# Patient Record
Sex: Male | Born: 1960 | Hispanic: No | Marital: Married | Smoking: Never smoker
Health system: Southern US, Community
[De-identification: ages and names within clinical notes are randomized; demographics above are authoritative.]

## PROBLEM LIST (undated history)

## (undated) DIAGNOSIS — T7840XA Allergy, unspecified, initial encounter: Secondary | ICD-10-CM

## (undated) HISTORY — PX: COLONOSCOPY: SHX174

## (undated) HISTORY — DX: Allergy, unspecified, initial encounter: T78.40XA

---

## 1998-09-14 ENCOUNTER — Other Ambulatory Visit: Admission: RE | Admit: 1998-09-14 | Discharge: 1998-09-14 | Payer: Self-pay | Admitting: Oncology

## 2005-11-21 ENCOUNTER — Ambulatory Visit: Payer: Self-pay

## 2013-10-10 HISTORY — PX: KIDNEY DONATION: SHX685

## 2013-10-10 HISTORY — PX: KIDNEY TRANSPLANT: SHX239

## 2014-04-02 DIAGNOSIS — D509 Iron deficiency anemia, unspecified: Secondary | ICD-10-CM | POA: Insufficient documentation

## 2014-04-02 DIAGNOSIS — D72819 Decreased white blood cell count, unspecified: Secondary | ICD-10-CM | POA: Insufficient documentation

## 2014-09-24 DIAGNOSIS — Z524 Kidney donor: Secondary | ICD-10-CM

## 2016-05-20 DIAGNOSIS — L209 Atopic dermatitis, unspecified: Secondary | ICD-10-CM | POA: Diagnosis not present

## 2016-11-14 DIAGNOSIS — L209 Atopic dermatitis, unspecified: Secondary | ICD-10-CM | POA: Diagnosis not present

## 2016-11-14 DIAGNOSIS — B351 Tinea unguium: Secondary | ICD-10-CM | POA: Diagnosis not present

## 2017-04-19 DIAGNOSIS — J020 Streptococcal pharyngitis: Secondary | ICD-10-CM | POA: Diagnosis not present

## 2017-04-19 DIAGNOSIS — Z6829 Body mass index (BMI) 29.0-29.9, adult: Secondary | ICD-10-CM | POA: Diagnosis not present

## 2017-12-25 DIAGNOSIS — J309 Allergic rhinitis, unspecified: Secondary | ICD-10-CM | POA: Diagnosis not present

## 2017-12-25 DIAGNOSIS — Z6829 Body mass index (BMI) 29.0-29.9, adult: Secondary | ICD-10-CM | POA: Diagnosis not present

## 2018-05-08 DIAGNOSIS — L578 Other skin changes due to chronic exposure to nonionizing radiation: Secondary | ICD-10-CM | POA: Diagnosis not present

## 2018-05-08 DIAGNOSIS — B351 Tinea unguium: Secondary | ICD-10-CM | POA: Diagnosis not present

## 2018-05-08 DIAGNOSIS — B356 Tinea cruris: Secondary | ICD-10-CM | POA: Diagnosis not present

## 2018-05-11 DIAGNOSIS — R531 Weakness: Secondary | ICD-10-CM | POA: Diagnosis not present

## 2018-06-04 ENCOUNTER — Encounter: Payer: Self-pay | Admitting: Gastroenterology

## 2018-06-15 ENCOUNTER — Encounter: Payer: Self-pay | Admitting: Gastroenterology

## 2018-06-18 ENCOUNTER — Telehealth: Payer: Self-pay

## 2018-06-18 NOTE — Telephone Encounter (Signed)
As far as I can recall, was significant family history of colonic polyps in a first-degree relative. Can be use it as screening colon code?

## 2018-06-18 NOTE — Telephone Encounter (Signed)
Dr. Chales Abrahams,  I was preparing a pre-visit chart for [facility]. He had a screening colonoscopy 05/16/13. This patient appears to be a routine recall for family history of polyps. Last colonoscopy was normal. Patient is scheduled for a colonoscopy on 07/09/18. PV is scheduled on 06/25/18. It is my understanding that insurance may not pay for his colonoscopy.  The patient does not have any symptoms and has not been seen in the office for symptoms. Please advise. Thanks.    Janalee Dane, LPN ( PV )

## 2018-06-18 NOTE — Telephone Encounter (Signed)
np

## 2018-06-18 NOTE — Telephone Encounter (Signed)
Hi Dr. Chales Abrahams,  My apologies, I spoke to  Dr. Rhea Belton and he said " YES" it would be ok to use Family history of colon polyps as a diagnostic code for a first degree relative. We will proceed on as scheduled. Thanks!   Janalee Dane, LPN ( PV )

## 2018-06-25 ENCOUNTER — Encounter: Payer: Self-pay | Admitting: Gastroenterology

## 2018-06-25 ENCOUNTER — Ambulatory Visit (AMBULATORY_SURGERY_CENTER): Payer: Self-pay

## 2018-06-25 VITALS — Ht 68.0 in | Wt 183.2 lb

## 2018-06-25 DIAGNOSIS — Z8371 Family history of colonic polyps: Secondary | ICD-10-CM

## 2018-06-25 NOTE — Progress Notes (Signed)
No egg or soy allergy known to patient  No issues with past sedation with any surgeries  or procedures, no intubation problems  No diet pills per patient No home 02 use per patient  No blood thinners per patient  Pt denies issues with constipation  No A fib or A flutter  EMMI video sent to pt's e mail  Pt. declined 

## 2018-07-09 ENCOUNTER — Ambulatory Visit (AMBULATORY_SURGERY_CENTER): Payer: BLUE CROSS/BLUE SHIELD | Admitting: Gastroenterology

## 2018-07-09 ENCOUNTER — Encounter: Payer: Self-pay | Admitting: Gastroenterology

## 2018-07-09 VITALS — BP 119/89 | HR 61 | Temp 98.8°F | Resp 17 | Ht 68.0 in | Wt 183.0 lb

## 2018-07-09 DIAGNOSIS — Z1211 Encounter for screening for malignant neoplasm of colon: Secondary | ICD-10-CM

## 2018-07-09 DIAGNOSIS — Z86010 Personal history of colonic polyps: Secondary | ICD-10-CM | POA: Diagnosis not present

## 2018-07-09 DIAGNOSIS — Z8371 Family history of colonic polyps: Secondary | ICD-10-CM

## 2018-07-09 MED ORDER — SODIUM CHLORIDE 0.9 % IV SOLN: 500.0000 mL | Freq: Once | INTRAVENOUS | Status: DC

## 2018-07-09 NOTE — Progress Notes (Signed)
Report given to PACU, vss 

## 2018-07-09 NOTE — Patient Instructions (Signed)
HANDOUT GIVEN FOR HEMORRHOIDS  YOU HAD AN ENDOSCOPIC PROCEDURE TODAY AT THE [facility]:   Refer to the procedure report that was given to you for any specific questions about what was found during the examination.  If the procedure report does not answer your questions, please call your gastroenterologist to clarify.  If you requested that your care partner not be given the details of your procedure findings, then the procedure report has been included in a sealed envelope for you to review at your convenience later.  YOU SHOULD EXPECT: Some feelings of bloating in the abdomen. Passage of more gas than usual.  Walking can help get rid of the air that was put into your GI tract during the procedure and reduce the bloating. If you had a lower endoscopy (such as a colonoscopy or flexible sigmoidoscopy) you may notice spotting of blood in your stool or on the toilet paper. If you underwent a bowel prep for your procedure, you may not have a normal bowel movement for a few days.  Please Note:  You might notice some irritation and congestion in your nose or some drainage.  This is from the oxygen used during your procedure.  There is no need for concern and it should clear up in a day or so.  SYMPTOMS TO REPORT IMMEDIATELY:   Following lower endoscopy (colonoscopy or flexible sigmoidoscopy):  Excessive amounts of blood in the stool  Significant tenderness or worsening of abdominal pains  Swelling of the abdomen that is new, acute  Fever of 100F or higher  For urgent or emergent issues, a gastroenterologist can be reached at any hour by calling [phone].   DIET:  We do recommend a small meal at first, but then you may proceed to your regular diet.  Drink plenty of fluids but you should avoid alcoholic beverages for 24 hours.  ACTIVITY:  You should plan to take it easy for the rest of today and you should NOT DRIVE or use heavy machinery until tomorrow (because of the sedation  medicines used during the test).    FOLLOW UP: Our staff will call the number listed on your records the next business day following your procedure to check on you and address any questions or concerns that you may have regarding the information given to you following your procedure. If we do not reach you, we will leave a message.  However, if you are feeling well and you are not experiencing any problems, there is no need to return our call.  We will assume that you have returned to your regular daily activities without incident.  If any biopsies were taken you will be contacted by phone or by letter within the next 1-3 weeks.  Please call us at [phone] if you have not heard about the biopsies in 3 weeks.    SIGNATURES/CONFIDENTIALITY: You and/or your care partner have signed paperwork which will be entered into your electronic medical record.  These signatures attest to the fact that that the information above on your After Visit Summary has been reviewed and is understood.  Full responsibility of the confidentiality of this discharge information lies with you and/or your care-partner. 

## 2018-07-09 NOTE — Op Note (Signed)
[facility] Patient Name: Kent Kelly Procedure Date: 07/09/2018 8:07 AM MRN: [id number] Endoscopist: Lynann Bologna , MD Age: 57 Referring MD:  Date of Birth: 01/11/1961 Gender: Male Account #: [id number] Procedure:                Colonoscopy Indications:              Family history of colon polyps Medicines:                Monitored Anesthesia Care Procedure:                Pre-Anesthesia Assessment:                           - Prior to the procedure, a History and Physical                            was performed, and patient medications and                            allergies were reviewed. The patient's tolerance of                            previous anesthesia was also reviewed. The risks                            and benefits of the procedure and the sedation                            options and risks were discussed with the patient.                            All questions were answered, and informed consent                            was obtained. Prior Anticoagulants: The patient has                            taken no previous anticoagulant or antiplatelet                            agents. ASA Grade Assessment: II - A patient with                            mild systemic disease. After reviewing the risks                            and benefits, the patient was deemed in                            satisfactory condition to undergo the procedure.                           After obtaining informed consent, the colonoscope  was passed under direct vision. Throughout the                            procedure, the patient's blood pressure, pulse, and                            oxygen saturations were monitored continuously. The                            Model CF-HQ190L [id number]) scope was introduced                            through the anus and advanced to the the cecum,                            identified by appendiceal orifice  and ileocecal                            valve. The colonoscopy was performed without                            difficulty. The patient tolerated the procedure                            well. The quality of the bowel preparation was good. Scope In: 8:11:40 AM Scope Out: 8:23:12 AM Scope Withdrawal Time: 0 hours 8 minutes 17 seconds  Total Procedure Duration: 0 hours 11 minutes 32 seconds  Findings:                 Non-bleeding internal hemorrhoids were found. The                            hemorrhoids were small.                           The exam was otherwise without abnormality on                            direct and retroflexion views. Complications:            No immediate complications. Estimated Blood Loss:     Estimated blood loss: none. Impression:               - Non-bleeding internal hemorrhoids.                           - The examination was otherwise normal on direct                            and retroflexion views.                           - No specimens collected. Recommendation:           - Patient has a contact number available for  emergencies. The signs and symptoms of potential                            delayed complications were discussed with the                            patient. Return to normal activities tomorrow.                            Written discharge instructions were provided to the                            patient.                           - Resume previous diet.                           - Continue present medications.                           - Repeat colonoscopy in 10 years for screening                            purposes. Earlier, if with any new problems or if                            there is any change in family history.                           - Return to GI clinic PRN. Lynann Bologna, MD 07/09/2018 8:27:22 AM This report has been signed electronically.

## 2018-07-10 ENCOUNTER — Telehealth: Payer: Self-pay

## 2018-07-10 ENCOUNTER — Telehealth: Payer: Self-pay | Admitting: *Deleted

## 2018-07-10 NOTE — Telephone Encounter (Signed)
First post procedure follow up call, no answer 

## 2018-07-10 NOTE — Telephone Encounter (Signed)
  Follow up Call-  Call back number 07/09/2018  Post procedure Call Back phone  # [phone]  Permission to leave phone message Yes  Some recent data might be hidden     Patient questions:  Message left to call us if necessary.

## 2018-08-17 ENCOUNTER — Telehealth: Payer: Self-pay | Admitting: Gastroenterology

## 2018-08-17 NOTE — Telephone Encounter (Signed)
Pt is requesting to speak with Dr. Chales Abrahams regarding his last colon, he is upset because he was told that his proc was going to be preventive but he received a bill for it. I transferred pt to billing but he called back again and stated that nobody answered and that he had called billing a couple of times before and got no help so pt is adamant to speak with Dr. Chales Abrahams and stated that after he speaks with him he would do anything else that he needs to do.

## 2018-08-17 NOTE — Telephone Encounter (Signed)
See note below

## 2018-08-17 NOTE — Telephone Encounter (Signed)
Should be screening colonoscopy Kent Kelly, can you please check on this Pl run it by our coder as well We did not remove any polyps

## 2018-08-20 NOTE — Telephone Encounter (Signed)
Sheri how do I get this to the coder?

## 2018-08-21 NOTE — Telephone Encounter (Signed)
Pt called regarding this message, I let him know that we are still working and looking into it and will give him a call when we have a resolution.

## 2018-08-21 NOTE — Telephone Encounter (Signed)
Noted  

## 2019-01-24 ENCOUNTER — Ambulatory Visit: Payer: BLUE CROSS/BLUE SHIELD

## 2019-07-01 ENCOUNTER — Other Ambulatory Visit: Payer: Self-pay

## 2019-07-01 ENCOUNTER — Ambulatory Visit: Payer: Self-pay

## 2019-07-28 DIAGNOSIS — K529 Noninfective gastroenteritis and colitis, unspecified: Secondary | ICD-10-CM | POA: Diagnosis not present

## 2019-07-30 ENCOUNTER — Inpatient Hospital Stay (HOSPITAL_COMMUNITY)
Admission: EM | Admit: 2019-07-30 | Discharge: 2019-08-08 | DRG: 342 | Disposition: A | Discharge status: Home / Self Care | Payer: BLUE CROSS/BLUE SHIELD | Attending: General Surgery | Admitting: General Surgery

## 2019-07-30 ENCOUNTER — Emergency Department (HOSPITAL_COMMUNITY): Payer: BLUE CROSS/BLUE SHIELD

## 2019-07-30 ENCOUNTER — Encounter (HOSPITAL_COMMUNITY): Payer: Self-pay | Admitting: *Deleted

## 2019-07-30 ENCOUNTER — Other Ambulatory Visit: Payer: Self-pay

## 2019-07-30 DIAGNOSIS — N289 Disorder of kidney and ureter, unspecified: Secondary | ICD-10-CM | POA: Diagnosis not present

## 2019-07-30 DIAGNOSIS — Z905 Acquired absence of kidney: Secondary | ICD-10-CM | POA: Diagnosis not present

## 2019-07-30 DIAGNOSIS — Z524 Kidney donor: Secondary | ICD-10-CM

## 2019-07-30 DIAGNOSIS — Z79899 Other long term (current) drug therapy: Secondary | ICD-10-CM

## 2019-07-30 DIAGNOSIS — E860 Dehydration: Secondary | ICD-10-CM | POA: Diagnosis present

## 2019-07-30 DIAGNOSIS — K3580 Unspecified acute appendicitis: Secondary | ICD-10-CM | POA: Diagnosis not present

## 2019-07-30 DIAGNOSIS — R7989 Other specified abnormal findings of blood chemistry: Secondary | ICD-10-CM

## 2019-07-30 DIAGNOSIS — R0602 Shortness of breath: Secondary | ICD-10-CM | POA: Diagnosis not present

## 2019-07-30 DIAGNOSIS — K56609 Unspecified intestinal obstruction, unspecified as to partial versus complete obstruction: Secondary | ICD-10-CM

## 2019-07-30 DIAGNOSIS — I129 Hypertensive chronic kidney disease with stage 1 through stage 4 chronic kidney disease, or unspecified chronic kidney disease: Secondary | ICD-10-CM | POA: Diagnosis not present

## 2019-07-30 DIAGNOSIS — K567 Ileus, unspecified: Secondary | ICD-10-CM | POA: Diagnosis not present

## 2019-07-30 DIAGNOSIS — R140 Abdominal distension (gaseous): Secondary | ICD-10-CM | POA: Diagnosis not present

## 2019-07-30 DIAGNOSIS — K352 Acute appendicitis with generalized peritonitis, without abscess: Secondary | ICD-10-CM | POA: Diagnosis not present

## 2019-07-30 DIAGNOSIS — N182 Chronic kidney disease, stage 2 (mild): Secondary | ICD-10-CM | POA: Insufficient documentation

## 2019-07-30 DIAGNOSIS — Z4682 Encounter for fitting and adjustment of non-vascular catheter: Secondary | ICD-10-CM | POA: Diagnosis not present

## 2019-07-30 DIAGNOSIS — R109 Unspecified abdominal pain: Secondary | ICD-10-CM | POA: Diagnosis not present

## 2019-07-30 DIAGNOSIS — Z0189 Encounter for other specified special examinations: Secondary | ICD-10-CM

## 2019-07-30 DIAGNOSIS — K37 Unspecified appendicitis: Secondary | ICD-10-CM | POA: Diagnosis present

## 2019-07-30 DIAGNOSIS — N1831 Chronic kidney disease, stage 3a: Secondary | ICD-10-CM | POA: Diagnosis not present

## 2019-07-30 DIAGNOSIS — N179 Acute kidney failure, unspecified: Secondary | ICD-10-CM | POA: Diagnosis present

## 2019-07-30 DIAGNOSIS — Z20828 Contact with and (suspected) exposure to other viral communicable diseases: Secondary | ICD-10-CM | POA: Diagnosis present

## 2019-07-30 DIAGNOSIS — Z8371 Family history of colonic polyps: Secondary | ICD-10-CM

## 2019-07-30 DIAGNOSIS — I10 Essential (primary) hypertension: Secondary | ICD-10-CM | POA: Diagnosis not present

## 2019-07-30 DIAGNOSIS — K659 Peritonitis, unspecified: Secondary | ICD-10-CM | POA: Diagnosis not present

## 2019-07-30 LAB — CBC
HCT: 48.5 % (ref 39.0–52.0)
Hemoglobin: 15.3 g/dL (ref 13.0–17.0)
MCH: 23 pg — ABNORMAL LOW (ref 26.0–34.0)
MCHC: 31.5 g/dL (ref 30.0–36.0)
MCV: 73 fL — ABNORMAL LOW (ref 80.0–100.0)
Platelets: 261 10*3/uL (ref 150–400)
RBC: 6.64 MIL/uL — ABNORMAL HIGH (ref 4.22–5.81)
RDW: 16.7 % — ABNORMAL HIGH (ref 11.5–15.5)
WBC: 10.9 10*3/uL — ABNORMAL HIGH (ref 4.0–10.5)
nRBC: 0 % (ref 0.0–0.2)

## 2019-07-30 LAB — COMPREHENSIVE METABOLIC PANEL
ALT: 16 U/L (ref 0–44)
AST: 17 U/L (ref 15–41)
Albumin: 3.6 g/dL (ref 3.5–5.0)
Alkaline Phosphatase: 57 U/L (ref 38–126)
Anion gap: 16 — ABNORMAL HIGH (ref 5–15)
BUN: 43 mg/dL — ABNORMAL HIGH (ref 6–20)
CO2: 19 mmol/L — ABNORMAL LOW (ref 22–32)
Calcium: 8.8 mg/dL — ABNORMAL LOW (ref 8.9–10.3)
Chloride: 95 mmol/L — ABNORMAL LOW (ref 98–111)
Creatinine, Ser: 2.56 mg/dL — ABNORMAL HIGH (ref 0.61–1.24)
GFR calc Af Amer: 31 mL/min — ABNORMAL LOW (ref >60)
GFR calc non Af Amer: 27 mL/min — ABNORMAL LOW (ref >60)
Glucose, Bld: 108 mg/dL — ABNORMAL HIGH (ref 70–99)
Potassium: 4.2 mmol/L (ref 3.5–5.1)
Sodium: 130 mmol/L — ABNORMAL LOW (ref 135–145)
Total Bilirubin: 1.2 mg/dL (ref 0.3–1.2)
Total Protein: 8.6 g/dL — ABNORMAL HIGH (ref 6.5–8.1)

## 2019-07-30 LAB — URINALYSIS, ROUTINE W REFLEX MICROSCOPIC
Bilirubin Urine: NEGATIVE
Glucose, UA: NEGATIVE mg/dL
Ketones, ur: 5 mg/dL — AB
Leukocytes,Ua: NEGATIVE
Nitrite: NEGATIVE
Protein, ur: 30 mg/dL — AB
Specific Gravity, Urine: 1.016 (ref 1.005–1.030)
pH: 5 (ref 5.0–8.0)

## 2019-07-30 LAB — LIPASE, BLOOD: Lipase: 21 U/L (ref 11–51)

## 2019-07-30 MED ORDER — MORPHINE SULFATE (PF) 2 MG/ML IV SOLN
2.0000 mg | INTRAVENOUS | Status: DC | PRN
  Administered 2019-07-30 – 2019-07-31 (×3): 2 mg via INTRAVENOUS
  Filled 2019-07-30 (×3): qty 1

## 2019-07-30 MED ORDER — SODIUM CHLORIDE 0.9 % IV BOLUS
1000.0000 mL | Freq: Once | INTRAVENOUS | Status: AC
  Administered 2019-07-30: 1000 mL via INTRAVENOUS

## 2019-07-30 MED ORDER — ACETAMINOPHEN 650 MG RE SUPP: 650.0000 mg | Freq: Four times a day (QID) | RECTAL | Status: DC | PRN

## 2019-07-30 MED ORDER — SODIUM CHLORIDE 0.9% FLUSH
3.0000 mL | Freq: Once | INTRAVENOUS | Status: AC
  Administered 2019-07-30: 3 mL via INTRAVENOUS

## 2019-07-30 MED ORDER — HEPARIN SODIUM (PORCINE) 5000 UNIT/ML IJ SOLN
5000.0000 [IU] | Freq: Three times a day (TID) | INTRAMUSCULAR | Status: DC
  Administered 2019-07-31: 5000 [IU] via SUBCUTANEOUS
  Filled 2019-07-30 (×2): qty 1

## 2019-07-30 MED ORDER — SODIUM CHLORIDE 0.9 % IV SOLN
INTRAVENOUS | Status: DC
  Administered 2019-07-31 (×2): via INTRAVENOUS

## 2019-07-30 MED ORDER — ONDANSETRON HCL 4 MG/2ML IJ SOLN
4.0000 mg | Freq: Once | INTRAMUSCULAR | Status: AC
  Administered 2019-07-30: 4 mg via INTRAVENOUS
  Filled 2019-07-30: qty 2

## 2019-07-30 MED ORDER — SIMETHICONE 80 MG PO CHEW
40.0000 mg | CHEWABLE_TABLET | Freq: Four times a day (QID) | ORAL | Status: DC | PRN
  Administered 2019-08-01: 40 mg via ORAL
  Filled 2019-07-30 (×2): qty 1

## 2019-07-30 MED ORDER — METOPROLOL TARTRATE 5 MG/5ML IV SOLN: 5.0000 mg | Freq: Four times a day (QID) | INTRAVENOUS | Status: DC | PRN

## 2019-07-30 MED ORDER — OXYCODONE HCL 5 MG PO TABS: 5.0000 mg | ORAL_TABLET | ORAL | Status: DC | PRN

## 2019-07-30 MED ORDER — ONDANSETRON HCL 4 MG/2ML IJ SOLN
4.0000 mg | Freq: Four times a day (QID) | INTRAMUSCULAR | Status: DC | PRN
  Administered 2019-07-31 – 2019-08-03 (×4): 4 mg via INTRAVENOUS
  Filled 2019-07-30 (×3): qty 2

## 2019-07-30 MED ORDER — ONDANSETRON 4 MG PO TBDP: 4.0000 mg | ORAL_TABLET | Freq: Four times a day (QID) | ORAL | Status: DC | PRN

## 2019-07-30 MED ORDER — MORPHINE SULFATE (PF) 4 MG/ML IV SOLN
4.0000 mg | Freq: Once | INTRAVENOUS | Status: AC
  Administered 2019-07-30: 4 mg via INTRAVENOUS
  Filled 2019-07-30: qty 1

## 2019-07-30 MED ORDER — PIPERACILLIN-TAZOBACTAM 3.375 G IVPB
3.3750 g | Freq: Three times a day (TID) | INTRAVENOUS | Status: AC
  Administered 2019-07-30 – 2019-08-06 (×22): 3.375 g via INTRAVENOUS
  Filled 2019-07-30 (×20): qty 50

## 2019-07-30 MED ORDER — ACETAMINOPHEN 325 MG PO TABS: 650.0000 mg | ORAL_TABLET | Freq: Four times a day (QID) | ORAL | Status: DC | PRN

## 2019-07-30 MED ORDER — PIPERACILLIN-TAZOBACTAM 3.375 G IVPB 30 MIN
3.3750 g | Freq: Once | INTRAVENOUS | Status: AC
  Administered 2019-07-30: 3.375 g via INTRAVENOUS
  Filled 2019-07-30: qty 50

## 2019-07-30 NOTE — ED Notes (Signed)
Patient ambulated to restroom with no assistance for UA.

## 2019-07-30 NOTE — H&P (Addendum)
Kent Kelly is an 58 y.o. male.   Chief Complaint: ab pain HPI: [id number] yom consult from Dr Ranae PalmsYelverton for abd pain.  He is husband of one of my breast cancer patients.  He has had diffuse abd pain since Saturday that has been worsening.  Nothing making better.  Not eating or drinking much.  Having some flatus and loose stools. No emesis.  No fevers.  He has prior left donor nephrectomy. He has had ct scan in er and appears to have appendicitis.  Past Medical History:  Diagnosis Date  . Allergy     Past Surgical History:  Procedure Laterality Date  . COLONOSCOPY    . KIDNEY TRANSPLANT Left 2015    Family History  Problem Relation Age of Onset  . Colon polyps Sister   . Colon cancer Neg Hx   . Esophageal cancer Neg Hx   . Stomach cancer Neg Hx   . Rectal cancer Neg Hx    Social History:  reports that he has never smoked. He has never used smokeless tobacco. He reports that he does not use drugs. No history on file for alcohol.  Allergies:  Allergies  Allergen Reactions  . Pollen Extract     (Not in a hospital admission)   Results for orders placed or performed during the hospital encounter of 07/30/19 (from the past 48 hour(s))  Lipase, blood     Status: None   Collection Time: 07/30/19  1:38 PM  Result Value Ref Range   Lipase 21 11 - 51 U/L    Comment: Performed at [facility], [address] [place], [place] [address]  Comprehensive metabolic panel     Status: Abnormal   Collection Time: 07/30/19  1:38 PM  Result Value Ref Range   Sodium 130 (L) 135 - 145 mmol/L   Potassium 4.2 3.5 - 5.1 mmol/L   Chloride 95 (L) 98 - 111 mmol/L   CO2 19 (L) 22 - 32 mmol/L   Glucose, Bld 108 (H) 70 - 99 mg/dL   BUN 43 (H) 6 - 20 mg/dL   Creatinine, Ser 6.042.56 (H) 0.61 - 1.24 mg/dL   Calcium 8.8 (L) 8.9 - 10.3 mg/dL   Total Protein 8.6 (H) 6.5 - 8.1 g/dL   Albumin 3.6 3.5 - 5.0 g/dL   AST 17 15 - 41 U/L   ALT 16 0 - 44 U/L   Alkaline Phosphatase 57 38 - 126 U/L    Total Bilirubin 1.2 0.3 - 1.2 mg/dL   GFR calc non Af Amer 27 (L) >60 mL/min   GFR calc Af Amer 31 (L) >60 mL/min   Anion gap 16 (H) 5 - 15    Comment: Performed at [facility], [address] [place], [place] [address]  CBC     Status: Abnormal   Collection Time: 07/30/19  1:38 PM  Result Value Ref Range   WBC 10.9 (H) 4.0 - 10.5 K/uL   RBC 6.64 (H) 4.22 - 5.81 MIL/uL   Hemoglobin 15.3 13.0 - 17.0 g/dL   HCT 11.948.5 14.739.0 - 82.952.0 %   MCV 73.0 (L) 80.0 - 100.0 fL   MCH 23.0 (L) 26.0 - 34.0 pg   MCHC 31.5 30.0 - 36.0 g/dL   RDW 56.216.7 (H) 13.011.5 - 86.515.5 %   Platelets 261 150 - 400 K/uL   nRBC 0.0 0.0 - 0.2 %    Comment: Performed at [facility], [address] [place],  [place] [address]  Urinalysis, Routine w reflex microscopic     Status: Abnormal   Collection Time: 07/30/19  6:45 PM  Result Value Ref Range   Color, Urine YELLOW YELLOW   APPearance HAZY (A) CLEAR   Specific Gravity, Urine 1.016 1.005 - 1.030   pH 5.0 5.0 - 8.0   Glucose, UA NEGATIVE NEGATIVE mg/dL   Hgb urine dipstick LARGE (A) NEGATIVE   Bilirubin Urine NEGATIVE NEGATIVE   Ketones, ur 5 (A) NEGATIVE mg/dL   Protein, ur 30 (A) NEGATIVE mg/dL   Nitrite NEGATIVE NEGATIVE   Leukocytes,Ua NEGATIVE NEGATIVE   RBC / HPF 0-5 0 - 5 RBC/hpf   WBC, UA 0-5 0 - 5 WBC/hpf   Bacteria, UA RARE (A) NONE SEEN   Mucus PRESENT    Amorphous Crystal PRESENT     Comment: Performed at [facility], [place] [address] [place], [place] [address]   Ct Abdomen Pelvis Wo Contrast  Result Date: 07/30/2019 CLINICAL DATA:  Abdominal pain and distention. Nausea. EXAM: CT ABDOMEN AND PELVIS WITHOUT CONTRAST TECHNIQUE: Multidetector CT imaging of the abdomen and pelvis was performed following the standard protocol without IV contrast. COMPARISON:  None. FINDINGS: Lower chest: No significant abnormality. Hepatobiliary: Liver parenchyma appears normal. Inhomogeneous subtle density in the  gallbladder could represent sludge or stones. No dilated bile ducts. The gallbladder is not distended and there is no gallbladder wall thickening. Pancreas: Unremarkable. No pancreatic ductal dilatation or surrounding inflammatory changes. Spleen: Normal in size without focal abnormality. Adrenals/Urinary Tract: Normal adrenal glands. Previous left nephrectomy(the patient donated a kidney). The right kidney is normal. Bladder is normal. Stomach/Bowel: The patient has appendicitis. The distal appendix is dilated to 15 mm. There are multiple fecaliths in the appendix. There are numerous dilated loops of small bowel. a there appears to be a single small bowel diverticulum in the distal ileum visible on image 67 of series 2. This could represent small-bowel obstruction or ileus secondary to the acute appendicitis in the right lower quadrant. Colon and stomach appear normal. Vascular/Lymphatic: No significant vascular findings are present. No enlarged abdominal or pelvic lymph nodes. Reproductive: Prostate is unremarkable. Other: No abdominal wall hernia or abnormality. No abdominopelvic ascites. Musculoskeletal: No acute or significant osseous findings. IMPRESSION: 1. Acute appendicitis with multiple fecaliths in the appendix. 2. Multiple dilated loops of small bowel which could represent small-bowel obstruction or ileus secondary to the acute appendicitis in the right lower quadrant. 3. Previous left nephrectomy. 4. Possible sludge or stones in the gallbladder. Electronically Signed   By: Lorriane Shire M.D.   On: 07/30/2019 17:54    Review of Systems  Constitutional: Negative for fever.  Gastrointestinal: Positive for abdominal pain and nausea.  All other systems reviewed and are negative.   Blood pressure (!) 135/95, pulse 89, temperature 97.9 F (36.6 C), temperature source Oral, resp. rate 17, height 5\' 8"  (1.727 m), weight 87.1 kg, SpO2 98 %. Physical Exam  Vitals reviewed. Constitutional: He is  oriented to person, place, and time. He appears well-developed and well-nourished. No distress.  HENT:  Head: Normocephalic and atraumatic.  Eyes: No scleral icterus.  Neck: Neck supple.  Cardiovascular: Normal rate, regular rhythm and normal heart sounds.  Respiratory: Effort normal and breath sounds normal.  GI: He exhibits distension (mild). Bowel sounds are decreased. There is abdominal tenderness (mild bilateral lower quadrants). No hernia.    Neurological: He is alert and oriented to person, place, and time.  Skin: Skin is warm and dry.  Assessment/Plan Appendicitis -await covid test, does not need to go to OR urgently -also has ARI- likely dehydration and will hydrate overnight, recheck in am -discussed likely lap appy tomorrow depending on above -npo after mn, he is mildly distended and does look like he has an ileus on the ct scan, hopefulliy this will resolve with abx in order to make appendectomy easier   Emelia Loron, MD 07/30/2019, 7:29 PM

## 2019-07-30 NOTE — ED Notes (Addendum)
Patient transported to CT. Visitor at bedside.

## 2019-07-30 NOTE — ED Provider Notes (Signed)
[facility]-EMERGENCY DEPT Provider Note   CSN: [id number] Arrival date & time: 07/30/19  1256     History   Chief Complaint Chief Complaint  Patient presents with  . Abdominal Pain    HPI Kent Kelly is a 58 y.o. male.     HPI Patient presents with abdominal pain and distention for the past 4 days.  He has had nausea and some vomiting.  Patient also endorses diarrhea.  Stool is watery.  Nonbloody.  Patient states he has been urinating well.  No hematuria.  Seen in urgent care several days ago and given prescription for Bentyl.  States this has not been improving his symptoms.  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  No other sick contacts.  Patient does endorse occasional chills.   Past Medical History:  Diagnosis Date  . Allergy     There are no active problems to display for this patient.   Past Surgical History:  Procedure Laterality Date  . COLONOSCOPY    . KIDNEY TRANSPLANT Left 2015        Home Medications    Prior to Admission medications   Medication Sig Start Date End Date Taking? Authorizing Provider  cetirizine (ZYRTEC) 10 MG tablet Take 10 mg by mouth daily as needed for allergies.   Yes [provider]  dicyclomine (BENTYL) 10 MG capsule Take 10 mg by mouth 3 (three) times daily. x7 days 07/28/19 08/04/19 Yes [provider]  fluconazole (DIFLUCAN) 200 MG tablet TK 1 T PO  TWICE WEEKLY 05/10/18   [provider]    Family History Family History  Problem Relation Age of Onset  . Colon polyps Sister   . Colon cancer Neg Hx   . Esophageal cancer Neg Hx   . Stomach cancer Neg Hx   . Rectal cancer Neg Hx     Social History Social History   Tobacco Use  . Smoking status: Never Smoker  . Smokeless tobacco: Never Used  Substance Use Topics  . Alcohol use: Not on file    Comment: occasionally  . Drug use: Never     Allergies   Pollen extract   Review of Systems Review of  Systems  Constitutional: Positive for appetite change and chills. Negative for fever.  HENT: Negative for sore throat and trouble swallowing.   Eyes: Negative for visual disturbance.  Respiratory: Negative for shortness of breath.   Cardiovascular: Negative for chest pain.  Gastrointestinal: Positive for abdominal distention, abdominal pain, diarrhea, nausea and vomiting. Negative for blood in stool and constipation.  Genitourinary: Negative for dysuria, flank pain, frequency and hematuria.  Musculoskeletal: Negative for back pain, myalgias and neck pain.  Skin: Negative for rash and wound.  Neurological: Negative for dizziness, weakness, light-headedness, numbness and headaches.  All other systems reviewed and are negative.    Physical Exam Updated Vital Signs BP (!) 140/93   Pulse 91   Temp 97.9 F (36.6 C) (Oral)   Resp 18   Ht 5\' 8"  (1.727 m)   Wt 87.1 kg   SpO2 95%   BMI 29.19 kg/m   Physical Exam Vitals signs and nursing note reviewed.  Constitutional:      General: He is not in acute distress.    Appearance: Normal appearance. He is well-developed. He is not ill-appearing.  HENT:     Head: Normocephalic and atraumatic.     Nose: Nose normal.     Mouth/Throat:  Mouth: Mucous membranes are moist.  Eyes:     Extraocular Movements: Extraocular movements intact.     Pupils: Pupils are equal, round, and reactive to light.  Neck:     Musculoskeletal: Normal range of motion and neck supple. No neck rigidity or muscular tenderness.  Cardiovascular:     Rate and Rhythm: Normal rate and regular rhythm.     Heart sounds: No murmur. No friction rub. No gallop.   Pulmonary:     Effort: Pulmonary effort is normal.     Breath sounds: Normal breath sounds.  Abdominal:     General: There is distension.     Palpations: Abdomen is soft.     Tenderness: There is abdominal tenderness. There is no right CVA tenderness, left CVA tenderness, guarding or rebound.     Comments:  Diminished breath sounds throughout.  Abdomen is distended and diffusely tender though appears most tender in the lower quadrants.  No rebound or guarding.  Musculoskeletal: Normal range of motion.        General: No tenderness.  Lymphadenopathy:     Cervical: No cervical adenopathy.  Skin:    General: Skin is warm and dry.     Capillary Refill: Capillary refill takes less than 2 seconds.     Findings: No erythema or rash.  Neurological:     General: No focal deficit present.     Mental Status: He is alert and oriented to person, place, and time.  Psychiatric:        Behavior: Behavior normal.      ED Treatments / Results  Labs (all labs ordered are listed, but only abnormal results are displayed) Labs Reviewed  COMPREHENSIVE METABOLIC PANEL - Abnormal; Notable for the following components:      Result Value   Sodium 130 (*)    Chloride 95 (*)    CO2 19 (*)    Glucose, Bld 108 (*)    BUN 43 (*)    Creatinine, Ser 2.56 (*)    Calcium 8.8 (*)    Total Protein 8.6 (*)    GFR calc non Af Amer 27 (*)    GFR calc Af Amer 31 (*)    Anion gap 16 (*)    All other components within normal limits  CBC - Abnormal; Notable for the following components:   WBC 10.9 (*)    RBC 6.64 (*)    MCV 73.0 (*)    MCH 23.0 (*)    RDW 16.7 (*)    All other components within normal limits  SARS CORONAVIRUS 2 (TAT 6-24 HRS)  SARS CORONAVIRUS 2 BY RT PCR (HOSPITAL ORDER, PERFORMED IN [facility])  LIPASE, BLOOD  URINALYSIS, ROUTINE W REFLEX MICROSCOPIC    EKG None  Radiology Ct Abdomen Pelvis Wo Contrast  Result Date: 07/30/2019 CLINICAL DATA:  Abdominal pain and distention. Nausea. EXAM: CT ABDOMEN AND PELVIS WITHOUT CONTRAST TECHNIQUE: Multidetector CT imaging of the abdomen and pelvis was performed following the standard protocol without IV contrast. COMPARISON:  None. FINDINGS: Lower chest: No significant abnormality. Hepatobiliary: Liver parenchyma appears normal.  Inhomogeneous subtle density in the gallbladder could represent sludge or stones. No dilated bile ducts. The gallbladder is not distended and there is no gallbladder wall thickening. Pancreas: Unremarkable. No pancreatic ductal dilatation or surrounding inflammatory changes. Spleen: Normal in size without focal abnormality. Adrenals/Urinary Tract: Normal adrenal glands. Previous left nephrectomy(the patient donated a kidney). The right kidney is normal. Bladder is normal. Stomach/Bowel: The patient  has appendicitis. The distal appendix is dilated to 15 mm. There are multiple fecaliths in the appendix. There are numerous dilated loops of small bowel. a there appears to be a single small bowel diverticulum in the distal ileum visible on image 67 of series 2. This could represent small-bowel obstruction or ileus secondary to the acute appendicitis in the right lower quadrant. Colon and stomach appear normal. Vascular/Lymphatic: No significant vascular findings are present. No enlarged abdominal or pelvic lymph nodes. Reproductive: Prostate is unremarkable. Other: No abdominal wall hernia or abnormality. No abdominopelvic ascites. Musculoskeletal: No acute or significant osseous findings. IMPRESSION: 1. Acute appendicitis with multiple fecaliths in the appendix. 2. Multiple dilated loops of small bowel which could represent small-bowel obstruction or ileus secondary to the acute appendicitis in the right lower quadrant. 3. Previous left nephrectomy. 4. Possible sludge or stones in the gallbladder. Electronically Signed   By: Lorriane Shire M.D.   On: 07/30/2019 17:54    Procedures Procedures (including critical care time)  Medications Ordered in ED Medications  piperacillin-tazobactam (ZOSYN) IVPB 3.375 g (has no administration in time range)  sodium chloride flush (NS) 0.9 % injection 3 mL (3 mLs Intravenous Given 07/30/19 1744)  sodium chloride 0.9 % bolus 1,000 mL (1,000 mLs Intravenous New Bag/Given  07/30/19 1745)  morphine 4 MG/ML injection 4 mg (4 mg Intravenous Given 07/30/19 1744)  ondansetron (ZOFRAN) injection 4 mg (4 mg Intravenous Given 07/30/19 1743)     Initial Impression / Assessment and Plan / ED Course  I have reviewed the triage vital signs and the nursing notes.  Pertinent labs & imaging results that were available during my care of the patient were reviewed by me and considered in my medical decision making (see chart for details).        Evidence of acute appendicitis and questionable small bowel obstruction versus ileus on CT scan.  Patient also does have an elevation in his baseline creatinine.  Initiated IV fluids and antibiotics.  Discussed with Dr. Donne Hazel who will see patient in the emergency department and admit.  Final Clinical Impressions(s) / ED Diagnoses   Final diagnoses:  Acute appendicitis, unspecified acute appendicitis type  Renal insufficiency    ED Discharge Orders    None       Julianne Rice, MD 07/30/19 [phone]

## 2019-07-30 NOTE — ED Notes (Signed)
Kent Kelly (son) wants to be contacted after patients surgery. [phone])

## 2019-07-30 NOTE — ED Notes (Signed)
Called lab to let them know we need rapid swab. Lab stated that was fine.

## 2019-07-30 NOTE — ED Triage Notes (Signed)
Pt states he ate at several restaurants on Friday. Developed abd pain and spasms on Sat and they continue. BM loose watery today. Went to [facility] Sunday morning, was prescribed something for spasms. It did not help. Pain is all over with increased pain under [place]

## 2019-07-31 ENCOUNTER — Inpatient Hospital Stay (HOSPITAL_COMMUNITY): Payer: BLUE CROSS/BLUE SHIELD | Admitting: Anesthesiology

## 2019-07-31 ENCOUNTER — Encounter (HOSPITAL_COMMUNITY): Payer: Self-pay | Admitting: Anesthesiology

## 2019-07-31 ENCOUNTER — Encounter (HOSPITAL_COMMUNITY): Admission: EM | Disposition: A | Discharge status: Home / Self Care | Payer: Self-pay | Source: Home / Self Care

## 2019-07-31 ENCOUNTER — Other Ambulatory Visit: Payer: Self-pay

## 2019-07-31 DIAGNOSIS — K3580 Unspecified acute appendicitis: Secondary | ICD-10-CM | POA: Diagnosis present

## 2019-07-31 DIAGNOSIS — K352 Acute appendicitis with generalized peritonitis, without abscess: Secondary | ICD-10-CM | POA: Diagnosis not present

## 2019-07-31 HISTORY — PX: LAPAROSCOPIC APPENDECTOMY: SHX408

## 2019-07-31 LAB — CBC
HCT: 42.1 % (ref 39.0–52.0)
Hemoglobin: 13.5 g/dL (ref 13.0–17.0)
MCH: 22.8 pg — ABNORMAL LOW (ref 26.0–34.0)
MCHC: 32.1 g/dL (ref 30.0–36.0)
MCV: 71 fL — ABNORMAL LOW (ref 80.0–100.0)
Platelets: 264 10*3/uL (ref 150–400)
RBC: 5.93 MIL/uL — ABNORMAL HIGH (ref 4.22–5.81)
RDW: 14.6 % (ref 11.5–15.5)
WBC: 8.4 10*3/uL (ref 4.0–10.5)
nRBC: 0 % (ref 0.0–0.2)

## 2019-07-31 LAB — BASIC METABOLIC PANEL
Anion gap: 13 (ref 5–15)
BUN: 44 mg/dL — ABNORMAL HIGH (ref 6–20)
CO2: 22 mmol/L (ref 22–32)
Calcium: 8.3 mg/dL — ABNORMAL LOW (ref 8.9–10.3)
Chloride: 99 mmol/L (ref 98–111)
Creatinine, Ser: 2.59 mg/dL — ABNORMAL HIGH (ref 0.61–1.24)
GFR calc Af Amer: 30 mL/min — ABNORMAL LOW (ref >60)
GFR calc non Af Amer: 26 mL/min — ABNORMAL LOW (ref >60)
Glucose, Bld: 121 mg/dL — ABNORMAL HIGH (ref 70–99)
Potassium: 3.9 mmol/L (ref 3.5–5.1)
Sodium: 134 mmol/L — ABNORMAL LOW (ref 135–145)

## 2019-07-31 LAB — POCT I-STAT, CHEM 8
BUN: 44 mg/dL — ABNORMAL HIGH (ref 6–20)
Calcium, Ion: 0.78 mmol/L — CL (ref 1.15–1.40)
Chloride: 107 mmol/L (ref 98–111)
Creatinine, Ser: 2.4 mg/dL — ABNORMAL HIGH (ref 0.61–1.24)
Glucose, Bld: 100 mg/dL — ABNORMAL HIGH (ref 70–99)
HCT: 44 % (ref 39.0–52.0)
Hemoglobin: 15 g/dL (ref 13.0–17.0)
Potassium: 4.4 mmol/L (ref 3.5–5.1)
Sodium: 132 mmol/L — ABNORMAL LOW (ref 135–145)
TCO2: 21 mmol/L — ABNORMAL LOW (ref 22–32)

## 2019-07-31 LAB — SARS CORONAVIRUS 2 (TAT 6-24 HRS): SARS Coronavirus 2: NEGATIVE

## 2019-07-31 LAB — MAGNESIUM: Magnesium: 2.8 mg/dL — ABNORMAL HIGH (ref 1.7–2.4)

## 2019-07-31 SURGERY — APPENDECTOMY, LAPAROSCOPIC
Anesthesia: General | Site: Abdomen

## 2019-07-31 MED ORDER — PROPOFOL 10 MG/ML IV BOLUS
INTRAVENOUS | Status: DC | PRN
  Administered 2019-07-31: 150 mg via INTRAVENOUS

## 2019-07-31 MED ORDER — FENTANYL CITRATE (PF) 100 MCG/2ML IJ SOLN
INTRAMUSCULAR | Status: DC | PRN
  Administered 2019-07-31: 100 ug via INTRAVENOUS
  Administered 2019-07-31 (×2): 50 ug via INTRAVENOUS

## 2019-07-31 MED ORDER — PROPOFOL 10 MG/ML IV BOLUS
INTRAVENOUS | Status: AC
  Filled 2019-07-31: qty 20

## 2019-07-31 MED ORDER — SUGAMMADEX SODIUM 200 MG/2ML IV SOLN
INTRAVENOUS | Status: DC | PRN
  Administered 2019-07-31: 200 mg via INTRAVENOUS

## 2019-07-31 MED ORDER — SODIUM CHLORIDE 0.9 % IV SOLN
INTRAVENOUS | Status: AC
  Administered 2019-07-31 (×2): via INTRAVENOUS

## 2019-07-31 MED ORDER — 0.9 % SODIUM CHLORIDE (POUR BTL) OPTIME
TOPICAL | Status: DC | PRN
  Administered 2019-07-31: 1000 mL

## 2019-07-31 MED ORDER — LIDOCAINE HCL (PF) 1 % IJ SOLN
INTRAMUSCULAR | Status: DC | PRN
  Administered 2019-07-31: 11.5 mL

## 2019-07-31 MED ORDER — MIDAZOLAM HCL 2 MG/2ML IJ SOLN
INTRAMUSCULAR | Status: AC
  Filled 2019-07-31: qty 2

## 2019-07-31 MED ORDER — BUPIVACAINE-EPINEPHRINE 0.25% -1:200000 IJ SOLN
INTRAMUSCULAR | Status: DC | PRN
  Administered 2019-07-31: 11.5 mL

## 2019-07-31 MED ORDER — BUPIVACAINE HCL (PF) 0.25 % IJ SOLN
INTRAMUSCULAR | Status: AC
  Filled 2019-07-31: qty 30

## 2019-07-31 MED ORDER — OXYCODONE HCL 5 MG PO TABS
5.0000 mg | ORAL_TABLET | ORAL | Status: DC | PRN
  Administered 2019-07-31 (×2): 10 mg via ORAL
  Filled 2019-07-31 (×2): qty 2

## 2019-07-31 MED ORDER — FENTANYL CITRATE (PF) 100 MCG/2ML IJ SOLN
INTRAMUSCULAR | Status: AC
  Filled 2019-07-31: qty 2

## 2019-07-31 MED ORDER — DEXAMETHASONE SODIUM PHOSPHATE 10 MG/ML IJ SOLN
INTRAMUSCULAR | Status: DC | PRN
  Administered 2019-07-31: 10 mg via INTRAVENOUS

## 2019-07-31 MED ORDER — ROCURONIUM BROMIDE 10 MG/ML (PF) SYRINGE
PREFILLED_SYRINGE | INTRAVENOUS | Status: DC | PRN
  Administered 2019-07-31: 60 mg via INTRAVENOUS

## 2019-07-31 MED ORDER — PIPERACILLIN-TAZOBACTAM 3.375 G IVPB
INTRAVENOUS | Status: AC
  Filled 2019-07-31: qty 50

## 2019-07-31 MED ORDER — ACETAMINOPHEN 10 MG/ML IV SOLN
1000.0000 mg | Freq: Four times a day (QID) | INTRAVENOUS | Status: DC
  Administered 2019-07-31: 1000 mg via INTRAVENOUS
  Filled 2019-07-31 (×3): qty 100

## 2019-07-31 MED ORDER — SODIUM CHLORIDE 0.9 % IV BOLUS
1000.0000 mL | Freq: Once | INTRAVENOUS | Status: AC
  Administered 2019-07-31: 1000 mL via INTRAVENOUS

## 2019-07-31 MED ORDER — MORPHINE SULFATE (PF) 2 MG/ML IV SOLN
2.0000 mg | INTRAVENOUS | Status: DC | PRN
  Administered 2019-07-31: 2 mg via INTRAVENOUS
  Filled 2019-07-31: qty 1

## 2019-07-31 MED ORDER — LIDOCAINE 2% (20 MG/ML) 5 ML SYRINGE
INTRAMUSCULAR | Status: DC | PRN
  Administered 2019-07-31: 100 mg via INTRAVENOUS

## 2019-07-31 MED ORDER — ACETAMINOPHEN 500 MG PO TABS
1000.0000 mg | ORAL_TABLET | Freq: Three times a day (TID) | ORAL | Status: DC
  Administered 2019-08-01 – 2019-08-03 (×6): 1000 mg via ORAL
  Filled 2019-07-31 (×6): qty 2

## 2019-07-31 MED ORDER — MIDAZOLAM HCL 5 MG/5ML IJ SOLN
INTRAMUSCULAR | Status: DC | PRN
  Administered 2019-07-31: 2 mg via INTRAVENOUS

## 2019-07-31 MED ORDER — SODIUM CHLORIDE 0.9 % IV SOLN
INTRAVENOUS | Status: DC | PRN
  Administered 2019-07-31 (×2): via INTRAVENOUS

## 2019-07-31 MED ORDER — LACTATED RINGERS IV SOLN: INTRAVENOUS | Status: DC | PRN

## 2019-07-31 SURGICAL SUPPLY — 38 items
ADH SKN CLS APL DERMABOND .7 (GAUZE/BANDAGES/DRESSINGS) ×1
APPLIER CLIP ROT 10 11.4 M/L (STAPLE)
APR CLP MED LRG 11.4X10 (STAPLE)
BAG SPEC RTRVL LRG 6X4 10 (ENDOMECHANICALS) ×1
CABLE HIGH FREQUENCY MONO STRZ (ELECTRODE) ×2 IMPLANT
CLIP APPLIE ROT 10 11.4 M/L (STAPLE) IMPLANT
COVER SURGICAL LIGHT HANDLE (MISCELLANEOUS) ×2 IMPLANT
COVER WAND RF STERILE (DRAPES) IMPLANT
CUTTER FLEX LINEAR 45M (STAPLE) ×1 IMPLANT
DECANTER SPIKE VIAL GLASS SM (MISCELLANEOUS) ×2 IMPLANT
DERMABOND ADVANCED (GAUZE/BANDAGES/DRESSINGS) ×1
DERMABOND ADVANCED .7 DNX12 (GAUZE/BANDAGES/DRESSINGS) ×1 IMPLANT
DRAPE LAPAROSCOPIC ABDOMINAL (DRAPES) ×2 IMPLANT
ELECT REM PT RETURN 15FT ADLT (MISCELLANEOUS) ×2 IMPLANT
ENDOLOOP SUT PDS II  0 18 (SUTURE)
ENDOLOOP SUT PDS II 0 18 (SUTURE) IMPLANT
GLOVE BIO SURGEON STRL SZ 6 (GLOVE) ×2 IMPLANT
GLOVE INDICATOR 6.5 STRL GRN (GLOVE) ×2 IMPLANT
GOWN STRL REUS W/TWL 2XL LVL3 (GOWN DISPOSABLE) ×2 IMPLANT
GOWN STRL REUS W/TWL XL LVL3 (GOWN DISPOSABLE) ×2 IMPLANT
KIT BASIN OR (CUSTOM PROCEDURE TRAY) ×2 IMPLANT
KIT TURNOVER KIT A (KITS) IMPLANT
POUCH SPECIMEN RETRIEVAL 10MM (ENDOMECHANICALS) ×2 IMPLANT
RELOAD 45 VASCULAR/THIN (ENDOMECHANICALS) IMPLANT
RELOAD STAPLE 45 2.5 WHT GRN (ENDOMECHANICALS) IMPLANT
RELOAD STAPLE 45 3.5 BLU ETS (ENDOMECHANICALS) IMPLANT
RELOAD STAPLE TA45 3.5 REG BLU (ENDOMECHANICALS) ×4 IMPLANT
SCISSORS LAP 5X35 DISP (ENDOMECHANICALS) ×2 IMPLANT
SET IRRIG TUBING LAPAROSCOPIC (IRRIGATION / IRRIGATOR) ×2 IMPLANT
SET TUBE SMOKE EVAC HIGH FLOW (TUBING) ×2 IMPLANT
SHEARS HARMONIC ACE PLUS 36CM (ENDOMECHANICALS) ×2 IMPLANT
SLEEVE XCEL OPT CAN 5 100 (ENDOMECHANICALS) ×2 IMPLANT
SUT MNCRL AB 4-0 PS2 18 (SUTURE) ×2 IMPLANT
TOWEL OR 17X26 10 PK STRL BLUE (TOWEL DISPOSABLE) ×2 IMPLANT
TRAY FOLEY MTR SLVR 16FR STAT (SET/KITS/TRAYS/PACK) IMPLANT
TRAY LAPAROSCOPIC (CUSTOM PROCEDURE TRAY) ×2 IMPLANT
TROCAR BLADELESS OPT 5 100 (ENDOMECHANICALS) ×2 IMPLANT
TROCAR XCEL BLUNT TIP 100MML (ENDOMECHANICALS) ×2 IMPLANT

## 2019-07-31 NOTE — Anesthesia Postprocedure Evaluation (Signed)
Anesthesia Post Note  Patient: Kent Kelly  Procedure(s) Performed: APPENDECTOMY LAPAROSCOPIC (N/A Abdomen)     Patient location during evaluation: PACU Anesthesia Type: General Level of consciousness: awake Pain management: pain level controlled Vital Signs Assessment: post-procedure vital signs reviewed and stable Respiratory status: spontaneous breathing Cardiovascular status: stable Postop Assessment: no apparent nausea or vomiting Anesthetic complications: no    Last Vitals:  Vitals:   07/31/19 1445 07/31/19 1500  BP: (!) 159/98 (!) 153/96  Pulse: 82 79  Resp: 16 16  Temp: 36.9 C (!) 36.3 C  SpO2: 100% 99%    Last Pain:  Vitals:   07/31/19 1500  TempSrc: Oral  PainSc: 5    Pain Goal: Patients Stated Pain Goal: 2 (07/31/19 1500)                 Huston Foley

## 2019-07-31 NOTE — Discharge Instructions (Signed)
LAPAROSCOPIC SURGERY: POST OP INSTRUCTIONS  ######################################################################  EAT Gradually transition to a high fiber diet with a fiber supplement over the next few weeks after discharge.  Start with a pureed / full liquid diet (see below)  WALK Walk an hour a day.  Control your pain to do that.    CONTROL PAIN Control pain so that you can walk, sleep, tolerate sneezing/coughing, go up/down stairs.  HAVE A BOWEL MOVEMENT DAILY Keep your bowels regular to avoid problems.  OK to try a laxative to override constipation.  OK to use an antidairrheal to slow down diarrhea.  Call if not better after 2 tries  CALL IF YOU HAVE PROBLEMS/CONCERNS Call if you are still struggling despite following these instructions. Call if you have concerns not answered by these instructions  ######################################################################    1. DIET: Follow a light bland diet & liquids the first 24 hours after arrival home, such as soup, liquids, starches, etc.  Be sure to drink plenty of fluids.  Quickly advance to a usual solid diet within a few days.  Avoid fast food or heavy meals as your are more likely to get nauseated or have irregular bowels.  A low-fat, high-fiber diet for the rest of your life is ideal.  2. Take your usually prescribed home medications unless otherwise directed.  3. PAIN CONTROL: a. Pain is best controlled by a usual combination of three different methods TOGETHER: i. Ice/Heat ii. Over the counter pain medication iii. Prescription pain medication b. Most patients will experience some swelling and bruising around the incisions.  Ice packs or heating pads (30-60 minutes up to 6 times a day) will help. Use ice for the first few days to help decrease swelling and bruising, then switch to heat to help relax tight/sore spots and speed recovery.  Some people prefer to use ice alone, heat alone, alternating between ice & heat.   Experiment to what works for you.  Swelling and bruising can take several weeks to resolve.   c. It is helpful to take an over-the-counter pain medication regularly for the first few weeks.  Choose one of the following that works best for you: i. Naproxen (Aleve, etc)  Two 220mg tabs twice a day ii. Ibuprofen (Advil, etc) Three 200mg tabs four times a day (every meal & bedtime) iii. Acetaminophen (Tylenol, etc) 500-650mg four times a day (every meal & bedtime) d. A  prescription for pain medication (such as oxycodone, hydrocodone, tramadol, gabapentin, methocarbamol, etc) should be given to you upon discharge.  Take your pain medication as prescribed.  i. If you are having problems/concerns with the prescription medicine (does not control pain, nausea, vomiting, rash, itching, etc), please call us [phone] to see if we need to switch you to a different pain medicine that will work better for you and/or control your side effect better. ii. If you need a refill on your pain medication, please give us 48 hour notice.  contact your pharmacy.  They will contact our office to request authorization. Prescriptions will not be filled after 5 pm or on week-ends  4. Avoid getting constipated.   a. Between the surgery and the pain medications, it is common to experience some constipation.   b. Increasing fluid intake and taking a fiber supplement (such as Metamucil, Citrucel, FiberCon, MiraLax, etc) 1-2 times a day regularly will usually help prevent this problem from occurring.   c. A mild laxative (prune juice, Milk of Magnesia, MiraLax, etc) should be taken according to   package directions if there are no bowel movements after 48 hours.   5. Watch out for diarrhea.   a. If you have many loose bowel movements, simplify your diet to bland foods & liquids for a few days.   b. Stop any stool softeners and decrease your fiber supplement.   c. Switching to mild anti-diarrheal medications (Kayopectate, Pepto  Bismol) can help.   d. If this worsens or does not improve, please call us.  6. Wash / shower every day.  You may shower over the dressings as they are waterproof.  Continue to shower over incision(s) after the dressing is off.  7. Remove your waterproof bandages 5 days after surgery.  You may leave the incision open to air.  You may replace a dressing/Band-Aid to cover the incision for comfort if you wish.   8. ACTIVITIES as tolerated:   a. You may resume regular (light) daily activities beginning the next day--such as daily self-care, walking, climbing stairs--gradually increasing activities as tolerated.  If you can walk 30 minutes without difficulty, it is safe to try more intense activity such as jogging, treadmill, bicycling, low-impact aerobics, swimming, etc. b. Save the most intensive and strenuous activity for last such as sit-ups, heavy lifting, contact sports, etc  Refrain from any heavy lifting or straining until you are off narcotics for pain control.   c. DO NOT PUSH THROUGH PAIN.  Let pain be your guide: If it hurts to do something, don't do it.  Pain is your body warning you to avoid that activity for another week until the pain goes down. d. You may drive when you are no longer taking prescription pain medication, you can comfortably wear a seatbelt, and you can safely maneuver your car and apply brakes. e. You may have sexual intercourse when it is comfortable.  9. FOLLOW UP in our office a. Please call [facility] at [phone] to set up an appointment to see your surgeon in the office for a follow-up appointment approximately 2-3 weeks after your surgery. b. Make sure that you call for this appointment the day you arrive home to insure a convenient appointment time.  10. IF YOU HAVE DISABILITY OR FAMILY LEAVE FORMS, BRING THEM TO THE OFFICE FOR PROCESSING.  DO NOT GIVE THEM TO YOUR DOCTOR.   WHEN TO CALL US [phone]: 1. Poor pain control 2. Reactions / problems with new  medications (rash/itching, nausea, etc)  3. Fever over 101.5 F (38.5 C) 4. Inability to urinate 5. Nausea and/or vomiting 6. Worsening swelling or bruising 7. Continued bleeding from incision. 8. Increased pain, redness, or drainage from the incision   The clinic staff is available to answer your questions during regular business hours (8:30am-5pm).  Please don't hesitate to call and ask to speak to one of our nurses for clinical concerns.   If you have a medical emergency, go to the nearest emergency room or call 911.  A surgeon from [facility] is always on call at the hospitals   [facility] [address], [address], [place], [place]  [address] ? MAIN: [phone] ? TOLL FREE: [phone] ?  FAX [phone] [email or web address]   

## 2019-07-31 NOTE — Progress Notes (Signed)
Pharmacy Antibiotic Note  Kent Kelly is a 58 y.o. male admitted on 07/30/2019 with sepsis.  Pharmacy has been consulted for zosyn dosing.  Plan: Zosyn 3.375g IV q8h (4 hour infusion).  Height: 5\' 8"  (172.7 cm) Weight: 192 lb (87.1 kg) IBW/kg (Calculated) : 68.4  Temp (24hrs), Avg:98.4 F (36.9 C), Min:97.9 F (36.6 C), Max:98.8 F (37.1 C)  Recent Labs  Lab 07/30/19 1338  WBC 10.9*  CREATININE 2.56*    Estimated Creatinine Clearance: 33.8 mL/min (A) (by C-G formula based on SCr of 2.56 mg/dL (H)).    Allergies  Allergen Reactions  . Pollen Extract     Antimicrobials this admission: Zosyn 07/30/2019 >>   Dose adjustments this admission: -  Microbiology results: -  Thank you for allowing pharmacy to be a part of this patient's care.  Nani Skillern Crowford 07/31/2019 2:59 AM

## 2019-07-31 NOTE — Op Note (Signed)
Appendectomy, Lap, Procedure Note  Indications: The patient presented with a history of right-sided abdominal pain. A CT revealed findings consistent with acute appendicitis.  Pre-operative Diagnosis: acute appendicitis  Post-operative Diagnosis: Acute supporative appendicitis with generalized peritonitis   Surgeon: Almond Lint   Assistants: n/a  Anesthesia: General endotracheal anesthesia and Local anesthesia 1% plain lidocaine, 0.25.% bupivacaine, with epinephrine  ASA Class: 2  Procedure Details  The patient was seen again in the Holding Room. The risks, benefits, complications, treatment options, and expected outcomes were discussed with the patient and/or family. The possibilities of perforation of viscus, bleeding, recurrent infection, the need for additional procedures, failure to diagnose a condition, and creating a complication requiring transfusion or operation were discussed. There was concurrence with the proposed plan and informed consent was obtained. The site of surgery was properly noted. The patient was taken to Operating Room, identified as Kent Kelly and the procedure verified as Appendectomy. A Time Out was held and the above information confirmed.  The patient was placed in the supine position and general anesthesia was induced, along with placement of orogastric tube, Venodyne boots, and a Foley catheter. The abdomen was prepped and draped in a sterile fashion. Local anesthetic was infiltrated in the infraumbilical region.  A 1.5 cm vertical incision was made just below the umbilicus.  The Kelly clamp was used to spread the subcutaneous tissues.  The fascia was elevated with 2 Kocher clamps and incised with the #11 blade.  A Tresa Endo was used to confirm entrance into the peritoneal cavity.  A pursestring suture was placed around the fascial incision.  The Hasson trocar was inserted into the abdomen and held in place with the tails of the suture.  The pneumoperitoneum was  then established to steady pressure of 15 mmHg.     Additional 5 mm cannulas then placed in the left lower quadrant of the abdomen and the suprapubic region under direct visualization.  A careful evaluation of the entire abdomen was carried out. The patient was placed in Trendelenburg and rotated to the left.  The small intestines were adherent to his prior kidney scar and these were bluntly gently taken down.  The bowels were then retracted in the cephalad and left lateral direction away from the pelvis and right lower quadrant. The terminal ileum was peeled up away from the retroperitoneum and the appendix was visualized.  The patient was found to have an enlarged and inflamed appendix in the infracecal position. There was no evidence of perforation.  The appendix was carefully dissected. The appendix was was skeletonized with the harmonic scalpel.   The appendix was divided at its base using an endo-GIA stapler. Minimal appendiceal stump was left in place. The appendix was removed from the abdomen with an Endocatch bag through the umbilical port.  There was no evidence of bleeding, leakage, or complication after division of the appendix. Irrigation was also performed and irrigate suctioned from the abdomen as well.    The distal small bowel was run around 3 feet and no evidence of diverticulum was seen.  The immediate terminal ileum was too inflamed to get a good look circumferentially.    The 5 mm trocars were removed.  The pneumoperitoneum was evacuated from the abdomen.    The trocar site skin wounds were closed with 4-0 Monocryl and dressed with Dermabond.  Instrument, sponge, and needle counts were correct at the conclusion of the case.   Findings: The appendix was found to be inflamed. There were  signs of necrosis.  There was not perforation. There was not abscess formation. There was supporative fluid around the appendix and the entire lower abdominal peritoneum was inflamed.  The bowel was  distended.    Estimated Blood Loss:  less than 50 mL         Drains: none.            Specimens: appendix         Complications:  None; patient tolerated the procedure well.         Disposition: PACU - hemodynamically stable.         Condition: stable

## 2019-07-31 NOTE — Progress Notes (Signed)
Bolus of NS infusing upon Kent Kelly arrival to Short Stay. Patient informed infusion needed to finish and I-STAT will be drawn prior to surgery. Pt voiced concerned if surgery will take place today. Informed anesthesia and surgeon will speak with him.

## 2019-07-31 NOTE — ED Notes (Signed)
Informed Consent is at bedside.

## 2019-07-31 NOTE — Progress Notes (Addendum)
I-STAT complete. Paged Dr. Norman Clay. Dr. Barry Dienes she will see patient.

## 2019-07-31 NOTE — Progress Notes (Addendum)
    HE:RDEYCXKGY pain  Subjective: Pt presented with acute appendicitis, and AKI, prior left donor nephrectomy. Initially thought it was food poisoning, but his wife was not sick.   Still having pain, but not as bad as yesterday.  Somewhat distended.    IV not running, staff change and nurse has not been in the room and is not aware of any patient hx.  He has been in ED since 1300 hrs yesterday.  Covid study reported 0207 hrs this AM.  Objective: Vital signs in last 24 hours: Temp:  [97.9 F (36.6 C)-98.8 F (37.1 C)] 97.9 F (36.6 C) (10/20 1627) Pulse Rate:  [78-91] 91 (10/21 0615) Resp:  [16-18] 17 (10/21 0615) BP: (115-152)/(66-100) 129/84 (10/21 0615) SpO2:  [94 %-100 %] 97 % (10/21 0615) Weight:  [87.1 kg] 87.1 kg (10/20 1316)  NO I/O No temp since 1600 yesterday WBC 10.9>>8.4 Intake/Output from previous day: 10/20 0701 - 10/21 0700 In: 1150 [IV Piggyback:1150] Out: -  Intake/Output this shift: No intake/output data recorded.  General appearance: alert, cooperative and no distress Resp: clear to auscultation bilaterally GI: distended, generalized tenderness, with RLQ most severe.  BS hypoactive.    Lab Results:  Recent Labs    07/30/19 1338 07/31/19 0554  WBC 10.9* 8.4  HGB 15.3 13.5  HCT 48.5 42.1  PLT 261 264    BMET Recent Labs    07/30/19 1338  NA 130*  K 4.2  CL 95*  CO2 19*  GLUCOSE 108*  BUN 43*  CREATININE 2.56*  CALCIUM 8.8*   PT/INR No results for input(s): LABPROT, INR in the last 72 hours.  Recent Labs  Lab 07/30/19 1338  AST 17  ALT 16  ALKPHOS 57  BILITOT 1.2  PROT 8.6*  ALBUMIN 3.6     Lipase     Component Value Date/Time   LIPASE 21 07/30/2019 1338   Prior to Admission medications   Medication Sig Start Date End Date Taking? Authorizing Provider  cetirizine (ZYRTEC) 10 MG tablet Take 10 mg by mouth daily as needed for allergies.   Yes [provider]  dicyclomine (BENTYL) 10 MG capsule Take 10 mg by mouth 3  (three) times daily. x7 days 07/28/19 08/04/19 Yes [provider]  fluconazole (DIFLUCAN) 200 MG tablet TK 1 T PO  TWICE WEEKLY 05/10/18   [provider]      Medications: . heparin  5,000 Units Subcutaneous Q8H   . sodium chloride 125 mL/hr at 07/31/19 0404  . piperacillin-tazobactam (ZOSYN)  IV Stopped (07/31/19 1856)   Assessment/Plan AKI- 2.56 >>  -  single kidney(prior kidney donation) COVID NEGATIVE  Acute appendicitis  Fen:  IV fluids/NPO ID:  Zosyn 10/20>> day 2 DVT:  Heparin - stopped at 0720 Follow up:  [facility]   Plan:  For surgery this AM; ASAP. I am rechecking BMP to watch his renal function, he did not have fluids going when I came into the room.   Repeat BMP: creatinine is 2.59; increase fluids to 150 ml/hr, and 1000 ml bolus over the next 2 hours.   LOS: 1 day    Tyja Gortney 07/31/2019 Please see Amion

## 2019-07-31 NOTE — Anesthesia Procedure Notes (Signed)
Procedure Name: Intubation Date/Time: 07/31/2019 1:16 PM Performed by: Lavina Hamman, CRNA Pre-anesthesia Checklist: Patient identified, Emergency Drugs available, Suction available, Patient being monitored and Timeout performed Patient Re-evaluated:Patient Re-evaluated prior to induction Oxygen Delivery Method: Circle system utilized Preoxygenation: Pre-oxygenation with 100% oxygen Induction Type: IV induction Ventilation: Mask ventilation without difficulty Laryngoscope Size: Mac and 3 Grade View: Grade I Tube type: Oral Tube size: 7.5 mm Number of attempts: 1 Airway Equipment and Method: Stylet Placement Confirmation: ETT inserted through vocal cords under direct vision,  positive ETCO2,  CO2 detector and breath sounds checked- equal and bilateral Secured at: 22 cm Tube secured with: Tape Dental Injury: Teeth and Oropharynx as per pre-operative assessment

## 2019-07-31 NOTE — Anesthesia Preprocedure Evaluation (Signed)
Anesthesia Evaluation  Patient identified by MRN, date of birth, ID band Patient awake    Reviewed: Allergy & Precautions, NPO status , Patient's Chart, lab work & pertinent test results  Airway Mallampati: I       Dental no notable dental hx. (+) Teeth Intact   Pulmonary neg pulmonary ROS,    Pulmonary exam normal breath sounds clear to auscultation       Cardiovascular negative cardio ROS Normal cardiovascular exam Rhythm:Regular Rate:Normal     Neuro/Psych negative neurological ROS  negative psych ROS   GI/Hepatic negative GI ROS, Neg liver ROS,   Endo/Other  negative endocrine ROS  Renal/GU Renal InsufficiencyRenal disease  negative genitourinary   Musculoskeletal negative musculoskeletal ROS (+)   Abdominal Normal abdominal exam  (+)   Peds  Hematology negative hematology ROS (+)   Anesthesia Other Findings   Reproductive/Obstetrics                             Anesthesia Physical Anesthesia Plan  ASA: II  Anesthesia Plan: General   Post-op Pain Management:    Induction:   PONV Risk Score and Plan: 4 or greater and Ondansetron, Dexamethasone and Midazolam  Airway Management Planned: Oral ETT  Additional Equipment: None  Intra-op Plan:   Post-operative Plan: Extubation in OR  Informed Consent: I have reviewed the patients History and Physical, chart, labs and discussed the procedure including the risks, benefits and alternatives for the proposed anesthesia with the patient or authorized representative who has indicated his/her understanding and acceptance.     Dental advisory given  Plan Discussed with: CRNA  Anesthesia Plan Comments:         Anesthesia Quick Evaluation

## 2019-07-31 NOTE — Transfer of Care (Signed)
Immediate Anesthesia Transfer of Care Note  Patient: Kent Kelly  Procedure(s) Performed: Procedure(s): APPENDECTOMY LAPAROSCOPIC (N/A)  Patient Location: PACU  Anesthesia Type:General  Level of Consciousness:  sedated, patient cooperative and responds to stimulation  Airway & Oxygen Therapy:Patient Spontanous Breathing and Patient connected to face mask oxgen  Post-op Assessment:  Report given to PACU RN and Post -op Vital signs reviewed and stable  Post vital signs:  Reviewed and stable  Last Vitals:  Vitals:   07/31/19 0807 07/31/19 1026  BP: 127/86 (!) 129/95  Pulse: 85 82  Resp: 15 18  Temp: 36.8 C 37.2 C  SpO2: 233% 612%    Complications: No apparent anesthesia complications

## 2019-08-01 ENCOUNTER — Inpatient Hospital Stay (HOSPITAL_COMMUNITY): Payer: BLUE CROSS/BLUE SHIELD

## 2019-08-01 ENCOUNTER — Encounter (HOSPITAL_COMMUNITY): Payer: Self-pay | Admitting: General Surgery

## 2019-08-01 DIAGNOSIS — R7989 Other specified abnormal findings of blood chemistry: Secondary | ICD-10-CM

## 2019-08-01 DIAGNOSIS — K3580 Unspecified acute appendicitis: Secondary | ICD-10-CM

## 2019-08-01 LAB — CBC
HCT: 35.7 % — ABNORMAL LOW (ref 39.0–52.0)
Hemoglobin: 11.5 g/dL — ABNORMAL LOW (ref 13.0–17.0)
MCH: 23 pg — ABNORMAL LOW (ref 26.0–34.0)
MCHC: 32.2 g/dL (ref 30.0–36.0)
MCV: 71.4 fL — ABNORMAL LOW (ref 80.0–100.0)
Platelets: 231 10*3/uL (ref 150–400)
RBC: 5 MIL/uL (ref 4.22–5.81)
RDW: 14.8 % (ref 11.5–15.5)
WBC: 8.1 10*3/uL (ref 4.0–10.5)
nRBC: 0 % (ref 0.0–0.2)

## 2019-08-01 LAB — BASIC METABOLIC PANEL
Anion gap: 9 (ref 5–15)
BUN: 32 mg/dL — ABNORMAL HIGH (ref 6–20)
CO2: 21 mmol/L — ABNORMAL LOW (ref 22–32)
Calcium: 7.3 mg/dL — ABNORMAL LOW (ref 8.9–10.3)
Chloride: 103 mmol/L (ref 98–111)
Creatinine, Ser: 2.21 mg/dL — ABNORMAL HIGH (ref 0.61–1.24)
GFR calc Af Amer: 37 mL/min — ABNORMAL LOW (ref >60)
GFR calc non Af Amer: 32 mL/min — ABNORMAL LOW (ref >60)
Glucose, Bld: 150 mg/dL — ABNORMAL HIGH (ref 70–99)
Potassium: 4.7 mmol/L (ref 3.5–5.1)
Sodium: 133 mmol/L — ABNORMAL LOW (ref 135–145)

## 2019-08-01 LAB — SURGICAL PATHOLOGY

## 2019-08-01 MED ORDER — HEPARIN SODIUM (PORCINE) 5000 UNIT/ML IJ SOLN
5000.0000 [IU] | Freq: Three times a day (TID) | INTRAMUSCULAR | Status: DC
  Administered 2019-08-01 – 2019-08-08 (×20): 5000 [IU] via SUBCUTANEOUS
  Filled 2019-08-01 (×20): qty 1

## 2019-08-01 MED ORDER — SODIUM CHLORIDE 0.9 % IV SOLN
INTRAVENOUS | Status: DC
  Administered 2019-08-01 – 2019-08-05 (×8): via INTRAVENOUS

## 2019-08-01 MED ORDER — METOCLOPRAMIDE HCL 10 MG/10ML PO SOLN
5.0000 mg | Freq: Four times a day (QID) | ORAL | Status: DC | PRN
  Filled 2019-08-01: qty 10

## 2019-08-01 NOTE — Progress Notes (Addendum)
Patient being taken down for Scan via wheelchair. Norlene Duel RN, BSN

## 2019-08-01 NOTE — Consult Note (Addendum)
Medical Consultation   Kent Kelly  HFW:[id number]  DOB: 09/06/1961  DOA: 07/30/2019  PCP: [facility]  Requesting physician: General surgery   Reason for consultation: Abnormal creatinine and hypertension    History of Present Illness: Kent Kelly is an 58 y.o. 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  At the time of admission his creatinine was 2.59 on 07/29/2021.  Now his creatinine is down to 2.21 with IV hydration.  Patient does not know what his baseline creatinine is.  His blood pressure has been up and down throughout the hospital stay.  He has not received any IV contrast nor he is on any nephrotoxic agents.  His I's and O's indicate he is positive by 4 L.  His urine output in the last 24 hours 2000 cc.  He denies having a bowel movement or flatus.  However he is belching.  Denies vomiting or nausea He gave me contact information to [facility] nephrology team who has seen him in the past.  Review of Systems:  As per HPI otherwise 10 point review of systems negative.    Past Medical History: Past Medical History:  Diagnosis Date  . Allergy     Past Surgical History: Past Surgical History:  Procedure Laterality Date  . COLONOSCOPY    . KIDNEY TRANSPLANT Left 2015  . LAPAROSCOPIC APPENDECTOMY N/A 07/31/2019   Procedure: APPENDECTOMY LAPAROSCOPIC;  Surgeon: Stark Klein, MD;  Location: [facility];  Service: General;  Laterality: N/A;     Allergies:   Allergies  Allergen Reactions  . Pollen Extract      Social History:  reports that he has never smoked. He has never used smokeless tobacco. He reports that he does not use drugs. No history on file for alcohol.   Family History: Family History  Problem Relation Age of Onset  . Colon polyps Sister   . Colon cancer Neg Hx   . Esophageal cancer Neg Hx   . Stomach cancer Neg Hx    . Rectal cancer Neg Hx      Physical Exam: Vitals:   07/31/19 1800 07/31/19 2157 08/01/19 0204 08/01/19 0520  BP: (!) 136/99 102/85 130/84 (!) 118/99  Pulse: 94 86 87 73  Resp: 18 17 18 18   Temp: 98.3 F (36.8 C) 98 F (36.7 C) 98.2 F (36.8 C) 98.1 F (36.7 C)  TempSrc: Oral Oral Oral Oral  SpO2: 98% 100% 100% 100%  Weight:      Height:        [place] Alert and awake, oriented x3, not in any acute distress. Eyes: PERLA, EOMI, irises appear normal, anicteric sclera,  ENMT: external ears and nose appear normal            Lips appears normal, oropharynx mucosa, tongue, posterior pharynx appear normal  Neck: neck appears normal, no masses, normal ROM, no thyromegaly, no JVD  CVS: S1-S2 clear, no murmur rubs or gallops, no LE edema, normal pedal pulses  Respiratory:  clear to auscultation bilaterally, no wheezing, rales or rhonchi. Respiratory effort normal. No accessory muscle use.  Abdomen: Distended diminished bowel sounds Musculoskeletal: : no cyanosis, clubbing or edema noted bilaterally  Neuro: Cranial nerves II-XII intact, strength, sensation, reflexes Psych: judgement and insight appear normal, stable mood and affect, mental status Skin: no rashes or lesions or ulcers,  no induration or nodules   Data reviewed:  I have personally reviewed following labs and imaging studies Labs:  CBC: Recent Labs  Lab 07/30/19 1338 07/31/19 0554 07/31/19 1126 08/01/19 0341  WBC 10.9* 8.4  --  8.1  HGB 15.3 13.5 15.0 11.5*  HCT 48.5 42.1 44.0 35.7*  MCV 73.0* 71.0*  --  71.4*  PLT 261 264  --  231    Basic Metabolic Panel: Recent Labs  Lab 07/30/19 1338 07/31/19 0826 07/31/19 1126 08/01/19 0341  NA 130* 134* 132* 133*  K 4.2 3.9 4.4 4.7  CL 95* 99 107 103  CO2 19* 22  --  21*  GLUCOSE 108* 121* 100* 150*  BUN 43* 44* 44* 32*  CREATININE 2.56* 2.59* 2.40* 2.21*  CALCIUM 8.8* 8.3*  --  7.3*  MG  --  2.8*  --   --    GFR Estimated Creatinine Clearance: 39.1  mL/min (A) (by C-G formula based on SCr of 2.21 mg/dL (H)). Liver Function Tests: Recent Labs  Lab 07/30/19 1338  AST 17  ALT 16  ALKPHOS 57  BILITOT 1.2  PROT 8.6*  ALBUMIN 3.6   Recent Labs  Lab 07/30/19 1338  LIPASE 21   No results for input(s): AMMONIA in the last 168 hours. Coagulation profile No results for input(s): INR, PROTIME in the last 168 hours.  Cardiac Enzymes: No results for input(s): CKTOTAL, CKMB, CKMBINDEX, TROPONINI in the last 168 hours. BNP: Invalid input(s): POCBNP CBG: No results for input(s): GLUCAP in the last 168 hours. D-Dimer No results for input(s): DDIMER in the last 72 hours. Hgb A1c No results for input(s): HGBA1C in the last 72 hours. Lipid Profile No results for input(s): CHOL, HDL, LDLCALC, TRIG, CHOLHDL, LDLDIRECT in the last 72 hours. Thyroid function studies No results for input(s): TSH, T4TOTAL, T3FREE, THYROIDAB in the last 72 hours.  Invalid input(s): FREET3 Anemia work up No results for input(s): VITAMINB12, FOLATE, FERRITIN, TIBC, IRON, RETICCTPCT in the last 72 hours. Urinalysis    Component Value Date/Time   COLORURINE YELLOW 07/30/2019 1845   APPEARANCEUR HAZY (A) 07/30/2019 1845   LABSPEC 1.016 07/30/2019 1845   PHURINE 5.0 07/30/2019 1845   GLUCOSEU NEGATIVE 07/30/2019 1845   HGBUR LARGE (A) 07/30/2019 1845   BILIRUBINUR NEGATIVE 07/30/2019 1845   KETONESUR 5 (A) 07/30/2019 1845   PROTEINUR 30 (A) 07/30/2019 1845   NITRITE NEGATIVE 07/30/2019 1845   LEUKOCYTESUR NEGATIVE 07/30/2019 1845     Microbiology Recent Results (from the past 240 hour(s))  SARS CORONAVIRUS 2 (TAT 6-24 HRS) Nasopharyngeal Nasopharyngeal Swab     Status: None   Collection Time: 07/30/19  5:49 PM   Specimen: Nasopharyngeal Swab  Result Value Ref Range Status   SARS Coronavirus 2 NEGATIVE NEGATIVE Final    Comment: (NOTE) SARS-CoV-2 target nucleic acids are NOT DETECTED. The SARS-CoV-2 RNA is generally detectable in upper and lower  respiratory specimens during the acute phase of infection. Negative results do not preclude SARS-CoV-2 infection, do not rule out co-infections with other pathogens, and should not be used as the sole basis for treatment or other patient management decisions. Negative results must be combined with clinical observations, patient history, and epidemiological information. The expected result is Negative. Fact Sheet for Patients: HairSlick.no Fact Sheet for Healthcare Providers: quierodirigir.com This test is not yet approved or cleared by the [place] FDA and  has been authorized for detection and/or diagnosis of SARS-CoV-2 by FDA under an Emergency Use Authorization (EUA). This EUA  will remain  in effect (meaning this test can be used) for the duration of the COVID-19 declaration under Section 56 4(b)(1) of the Act, 21 U.S.C. section 360bbb-3(b)(1), unless the authorization is terminated or revoked sooner. Performed at [facility], [address] [place], [place] [address]        Inpatient Medications:   Scheduled Meds: . acetaminophen  1,000 mg Oral Q8H   Continuous Infusions: . sodium chloride    . piperacillin-tazobactam (ZOSYN)  IV 3.375 g (08/01/19 0533)     Radiological Exams on Admission: Ct Abdomen Pelvis Wo Contrast  Result Date: 07/30/2019 CLINICAL DATA:  Abdominal pain and distention. Nausea. EXAM: CT ABDOMEN AND PELVIS WITHOUT CONTRAST TECHNIQUE: Multidetector CT imaging of the abdomen and pelvis was performed following the standard protocol without IV contrast. COMPARISON:  None. FINDINGS: Lower chest: No significant abnormality. Hepatobiliary: Liver parenchyma appears normal. Inhomogeneous subtle density in the gallbladder could represent sludge or stones. No dilated bile ducts. The gallbladder is not distended and there is no gallbladder wall thickening. Pancreas: Unremarkable. No pancreatic  ductal dilatation or surrounding inflammatory changes. Spleen: Normal in size without focal abnormality. Adrenals/Urinary Tract: Normal adrenal glands. Previous left nephrectomy(the patient donated a kidney). The right kidney is normal. Bladder is normal. Stomach/Bowel: The patient has appendicitis. The distal appendix is dilated to 15 mm. There are multiple fecaliths in the appendix. There are numerous dilated loops of small bowel. a there appears to be a single small bowel diverticulum in the distal ileum visible on image 67 of series 2. This could represent small-bowel obstruction or ileus secondary to the acute appendicitis in the right lower quadrant. Colon and stomach appear normal. Vascular/Lymphatic: No significant vascular findings are present. No enlarged abdominal or pelvic lymph nodes. Reproductive: Prostate is unremarkable. Other: No abdominal wall hernia or abnormality. No abdominopelvic ascites. Musculoskeletal: No acute or significant osseous findings. IMPRESSION: 1. Acute appendicitis with multiple fecaliths in the appendix. 2. Multiple dilated loops of small bowel which could represent small-bowel obstruction or ileus secondary to the acute appendicitis in the right lower quadrant. 3. Previous left nephrectomy. 4. Possible sludge or stones in the gallbladder. Electronically Signed   By: Francene BoyersJames  Maxwell M.D.   On: 07/30/2019 17:54    Impression/Recommendations Active Problems:   Appendicitis   Acute appendicitis  #1 AKI in a patient with single kidney.  He donated his left kidney to his wife in 2015.  His creatinine is trending in the right direction with IV hydration.  Continue IV hydration.  Patient not able to have normal p.o. intake due to recent surgery.  Patient does not know his baseline creatinine.  I have left a message with colleen at the [facility] at [phone] which is her cell number.  I have ordered a bladder scan and a renal ultrasound.  Follow-up labs  tomorrow.  If his creatinine is trending in the right direction he can follow-up with his PCP as an outpatient.  Addendum received a call back from choline with [facility] he is awake baseline creatinine was 1.68 in 2017.  He has not had any labs since 2017 nor has he followed up with the PCP.  she discussed with her nephrologist Dr. Eugenie Fillerenee Daniels who was not alarmed about his current creatinine level.  Once he is discharged he needs to follow-up with his PCP or nephrologist and keep an eye on his kidneys.  #2 hypertension his blood pressure has been up and down since  admission.  He denies her having a history of hypertension nor he is on any medications at home for it.  Hopefully this will improve with time.  Continue IV Lopressor while in hospital.  If the blood pressure trends to run high then will consider antihypertensives.  #3  Status post laparoscopic appendectomy -patient denies passing gas or having a bowel movement abdomen distended.  Per surgery.  Thank you for this consultation.  Our [facility] hospitalist team will follow the patient with you.    Alwyn Ren M.D. [facility] 08/01/2019, 8:48 AM

## 2019-08-01 NOTE — Progress Notes (Addendum)
1 Day Post-Op    CC: Abdominal pain  Subjective: Patient is feeling better this a.m.  He still distended with no flatus.  He is tolerating clear liquids well.  He is only had 700 cc of urine recorded.  We do not have a baseline creatinine on him.  His fluid appear to have been stopped last evening also.    Objective: Vital signs in last 24 hours: Temp:  [97.4 F (36.3 C)-99 F (37.2 C)] 98.1 F (36.7 C) (10/22 0520) Pulse Rate:  [73-94] 73 (10/22 0520) Resp:  [14-18] 18 (10/22 0520) BP: (102-159)/(84-99) 118/99 (10/22 0520) SpO2:  [98 %-100 %] 100 % (10/22 0520) Last BM Date: 07/30/19 920 p.o. 3000 IV 700 urine recorded No BM Afebrile vital signs are stable diastolic blood pressure still slightly elevated. NA 133, potassium 4.7, glucose 150, creatinine 2.21 GFR 37 WBC 8.1 H/H 11.5/35.7 Platelets 231,000. UA showed a large amount of hemoglobin and protein in the UA  Intake/Output from previous day: 10/21 0701 - 10/22 0700 In: 3998.1 [P.O.:920; I.V.:2875.1; IV Piggyback:203.1] Out: 750 [Urine:700; Blood:50] Intake/Output this shift: No intake/output data recorded.  General appearance: alert, cooperative and no distress Resp: clear to auscultation bilaterally GI: distended, BS hypoactive, no flatus, port sites OK, normal post op soreness  Lab Results:  Recent Labs    07/31/19 0554 07/31/19 1126 08/01/19 0341  WBC 8.4  --  8.1  HGB 13.5 15.0 11.5*  HCT 42.1 44.0 35.7*  PLT 264  --  231    BMET Recent Labs    07/31/19 0826 07/31/19 1126 08/01/19 0341  NA 134* 132* 133*  K 3.9 4.4 4.7  CL 99 107 103  CO2 22  --  21*  GLUCOSE 121* 100* 150*  BUN 44* 44* 32*  CREATININE 2.59* 2.40* 2.21*  CALCIUM 8.3*  --  7.3*   PT/INR No results for input(s): LABPROT, INR in the last 72 hours.  Recent Labs  Lab 07/30/19 1338  AST 17  ALT 16  ALKPHOS 57  BILITOT 1.2  PROT 8.6*  ALBUMIN 3.6     Lipase     Component Value Date/Time   LIPASE 21 07/30/2019  1338     Medications: . acetaminophen  1,000 mg Oral Q8H   . piperacillin-tazobactam (ZOSYN)  IV 3.375 g (08/01/19 0533)   Assessment/Plan AKI- 2.56 >>2.59>>2.40>>2.21  -  single kidney(prior kidney donation) COVID NEGATIVE  Acute supprative appendicitis with generalized peritonitis Laparoscopic appendectomy 07/31/2019 Dr. Almond Lint  FEN: IV fluids/clear liquids ID: Laqueta Jean 10/20 >> day 3 DVT: Restart heparin this afternoon [facility]-up: [facility]  Plan: I have asked Dr. Ashley Royalty to evaluate for his renal function.  We do not have a baseline on him.  He reports that as far as he knows he has never had any issues with his kidneys since he donated in 2015.  Currently we will leave him on clear liquids for now since he is not passing any flatus and he still fairly distended.  Continue IV fluids for now, await Dr. Ashley Royalty recommendations.  Recheck labs in a.m.  Continue Zosyn while he is here..  Bladder scan was ~ 150, he has voided some, but not a lot since shift change this AM.  He is now getting more distended and bloated.  I am taking him back to ice chips and sips for PO intake.   Renal ultrasound 10/22:  1. Status post left nephrectomy. 2. Normal right kidney.    LOS: 2 days  [place] [employer] 08/01/2019 Please see Amion

## 2019-08-01 NOTE — Progress Notes (Addendum)
Patient returned from scan, now in bed. Patient states he does not want to ambulate anymore at this time, and is aware of benefits of ambulation. Norlene Duel RN, BSN

## 2019-08-02 ENCOUNTER — Inpatient Hospital Stay (HOSPITAL_COMMUNITY): Payer: BLUE CROSS/BLUE SHIELD

## 2019-08-02 DIAGNOSIS — K352 Acute appendicitis with generalized peritonitis, without abscess: Principal | ICD-10-CM

## 2019-08-02 DIAGNOSIS — R7989 Other specified abnormal findings of blood chemistry: Secondary | ICD-10-CM | POA: Diagnosis not present

## 2019-08-02 DIAGNOSIS — I10 Essential (primary) hypertension: Secondary | ICD-10-CM | POA: Diagnosis not present

## 2019-08-02 DIAGNOSIS — Z905 Acquired absence of kidney: Secondary | ICD-10-CM

## 2019-08-02 LAB — BASIC METABOLIC PANEL
Anion gap: 10 (ref 5–15)
BUN: 29 mg/dL — ABNORMAL HIGH (ref 6–20)
CO2: 21 mmol/L — ABNORMAL LOW (ref 22–32)
Calcium: 8 mg/dL — ABNORMAL LOW (ref 8.9–10.3)
Chloride: 105 mmol/L (ref 98–111)
Creatinine, Ser: 2.14 mg/dL — ABNORMAL HIGH (ref 0.61–1.24)
GFR calc Af Amer: 38 mL/min — ABNORMAL LOW (ref >60)
GFR calc non Af Amer: 33 mL/min — ABNORMAL LOW (ref >60)
Glucose, Bld: 114 mg/dL — ABNORMAL HIGH (ref 70–99)
Potassium: 4.1 mmol/L (ref 3.5–5.1)
Sodium: 136 mmol/L (ref 135–145)

## 2019-08-02 LAB — CBC
HCT: 37 % — ABNORMAL LOW (ref 39.0–52.0)
Hemoglobin: 11.9 g/dL — ABNORMAL LOW (ref 13.0–17.0)
MCH: 22.6 pg — ABNORMAL LOW (ref 26.0–34.0)
MCHC: 32.2 g/dL (ref 30.0–36.0)
MCV: 70.2 fL — ABNORMAL LOW (ref 80.0–100.0)
Platelets: 279 10*3/uL (ref 150–400)
RBC: 5.27 MIL/uL (ref 4.22–5.81)
RDW: 14.6 % (ref 11.5–15.5)
WBC: 8.2 10*3/uL (ref 4.0–10.5)
nRBC: 0 % (ref 0.0–0.2)

## 2019-08-02 NOTE — Progress Notes (Signed)
PROGRESS NOTE  Kent Kelly CBJ:[id number] DOB: 1961-04-13 DOA: 07/30/2019 PCP: [facility]  Brief History   Kent Kelly is an 58 y.o. 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  At the time of admission his creatinine was 2.59 on 07/29/2021.  Now his creatinine is down to 2.21 with IV hydration.  Patient does not know what his baseline creatinine is.  His blood pressure has been up and down throughout the hospital stay.  He has not received any IV contrast nor he is on any nephrotoxic agents.  His I's and O's indicate he is positive by 4 L.  His urine output in the last 24 hours 2000 cc.  He denies having a bowel movement or flatus.  However he is belching.  Denies vomiting or nausea. Contact information to [facility] nephrology team was shared with consulting hospitalist in order to obtain more detailed information about his renal status. The patient has not seen a doctor since 2017. At that time his creatinine was 1.68.  Consultants  . Hospitalists  Procedures  . Laparoscopic appendectomy  Antibiotics   Anti-infectives (From admission, onward)   Start     Dose/Rate Route Frequency Ordered Stop   07/31/19 1314  piperacillin-tazobactam (ZOSYN) 3.375 GM/50ML IVPB    Note to Pharmacy: Carleene Cooper   : cabinet override      07/31/19 1314 07/31/19 1317   07/30/19 2315  piperacillin-tazobactam (ZOSYN) IVPB 3.375 g     3.375 g 12.5 mL/hr over 240 Minutes Intravenous Every 8 hours 07/30/19 2300     07/30/19 1815  piperacillin-tazobactam (ZOSYN) IVPB 3.375 g     3.375 g 100 mL/hr over 30 Minutes Intravenous  Once 07/30/19 1808 07/30/19 2035    .  Interval History/Subjective  The patient states that he is feeling better. He thinks that he has passed some flatus.  Objective   Vitals:  Vitals:   08/02/19 0506 08/02/19 1350  BP: (!) 144/95 (!) 153/104  Pulse: 87 92   Resp: 18 16  Temp: 98.8 F (37.1 C) 98.3 F (36.8 C)  SpO2: 100% 100%   Exam:  Constitutional:  . The patient is awake, alert, and oriented x 3. No acute distress. Respiratory:  . No increased work of breathing. . No wheezes, rales, or rhonchi . No tactile fremitus Cardiovascular:  . Regular rate and rhythm . No murmurs, ectopy, or gallups. . No lateral PMI. No thrills. Abdomen:  . Abdomen is soft, slightly tender and distended . No hernias, masses, or organomegaly . Hypoactive bowel sounds.  Musculoskeletal:  . No cyanosis, clubbing, or edema Skin:  . No rashes, lesions, ulcers . palpation of skin: no induration or nodules Neurologic:  . CN 2-12 intact . Sensation all 4 extremities intact Psychiatric:  . Mental status o Mood, affect appropriate o Orientation to person, place, time  . judgment and insight appear intact  I have personally reviewed the following:   Today's Data  . BMP, CBC, Vitals  Lab Data  . Renal ultrasound: Normal right kidney; surgically absent left kidney   Scheduled Meds: . acetaminophen  1,000 mg Oral Q8H  . heparin injection (subcutaneous)  5,000 Units Subcutaneous Q8H   Continuous Infusions: . sodium chloride 75 mL/hr at 08/02/19 0834  . piperacillin-tazobactam (ZOSYN)  IV 3.375 g (08/02/19 1310)    Active Problems:   Appendicitis   Acute appendicitis   Elevated serum creatinine  LOS: 3 days   A & P  AKI in a patient with single kidney: The patient donated his left kidney to his wife in 2015. The last time that he saw a doctor prior to this admission was 2017. His creatinine at that time was 1.68. His creatinine is trending in the right direction with IV hydration. It is 2.14 this morning. Continue IV hydration. Patient not able to have normal p.o. intake due to recent surgery. Renal ultrasound demonstrates a normal right kidney and a surgically absent left kidney. continue to monitor creatinine.  If his creatinine is trending  in the right direction, he can follow-up with his PCP as an outpatient. The patient will need to follow up with PCP and nephrologist on discharge.  Hypertension: The patient's blood pressure has been up and down since admission.  He denies her having a history of hypertension nor he is on any medications at home for it. Of course, he has not seen a doctor in 3 years. Monitor. Continue IV Lopressor while in hospital.  If the blood pressure trends to run high then will consider adding antihypertensives at discharge.  Status post laparoscopic appendectomy -patient denies passing gas or having a bowel movement abdomen distended.  Per surgery. Patient states that he has had some flatus today.  I have seen and examined this patient myself. I have spent 35 minutes in his evaluation and care.  Jenniah Bhavsar, DO [facility] Direct contact: see [email or web address]  7PM-7AM contact night coverage as above 08/02/2019, 4:13 PM  LOS: 3 days

## 2019-08-02 NOTE — Progress Notes (Signed)
2 Days Post-Op    CC: Abdominal pain  Subjective: He had a couple loose-gooey stools this a.m. per the patient.  He is still extremely distended bowel sounds are hypoactive and high-pitched.  Port sites look fine.  He is up walking in the halls quite a bit.  Objective: Vital signs in last 24 hours: Temp:  [98 F (36.7 C)-98.8 F (37.1 C)] 98.8 F (37.1 C) (10/23 0506) Pulse Rate:  [78-87] 87 (10/23 0506) Resp:  [18-20] 18 (10/23 0506) BP: (144-152)/(89-96) 144/95 (10/23 0506) SpO2:  [100 %] 100 % (10/23 0506) Last BM Date: 07/30/19 Nothing p.o. recorded 1881 IV 1850 urine BM x2 this a.m. Afebrile vital signs are stable Creatinine down to 2.14 Intake/Output from previous day: 10/22 0701 - 10/23 0700 In: 1881.9 [I.V.:1701.2; IV Piggyback:180.7] Out: 1850 [Urine:1850] Intake/Output this shift: No intake/output data recorded.  General appearance: alert, cooperative and no distress Resp: clear to auscultation bilaterally GI: Extremely distended, port sites look good, high-pitched hypoactive bowel sounds.  Lab Results:  Recent Labs    08/01/19 0341 08/02/19 0323  WBC 8.1 8.2  HGB 11.5* 11.9*  HCT 35.7* 37.0*  PLT 231 279    BMET Recent Labs    08/01/19 0341 08/02/19 0323  NA 133* 136  K 4.7 4.1  CL 103 105  CO2 21* 21*  GLUCOSE 150* 114*  BUN 32* 29*  CREATININE 2.21* 2.14*  CALCIUM 7.3* 8.0*   PT/INR No results for input(s): LABPROT, INR in the last 72 hours.  Recent Labs  Lab 07/30/19 1338  AST 17  ALT 16  ALKPHOS 57  BILITOT 1.2  PROT 8.6*  ALBUMIN 3.6     Lipase     Component Value Date/Time   LIPASE 21 07/30/2019 1338     Medications: . acetaminophen  1,000 mg Oral Q8H  . heparin injection (subcutaneous)  5,000 Units Subcutaneous Q8H    Assessment/Plan AKI- 2.56 >>2.59>>2.40>>2.21>>2.14(10/23) - single kidney(prior kidney donation) COVID NEGATIVE  Acute supprative appendicitis with generalized peritonitis Laparoscopic  appendectomy 07/31/2019 Dr. Stark Klein  - post op ileus  FEN: IV fluids/clear liquids ID: Zosyn 10/20 >> day 3 DVT: Restart heparin this afternoon Follow-up: [facility] clinic   Plan: I will leave him on ice chips and sips for now.  Just enough for oral comfort.  Continue bowel rest and allow ileus to resolve.  He is on IV fluids for hydration.    LOS: 3 days    Wahneta Derocher 08/02/2019 Please see Amion

## 2019-08-02 NOTE — Progress Notes (Signed)
On call MD paged about patient having abdominal distension, no n/v, increased abdominal pain, CT results and that simethicone was not effective per patient. New orders obtained for abdominal scan. Patient has been made aware and has no further questions. Norlene Duel RN, BSN

## 2019-08-03 ENCOUNTER — Inpatient Hospital Stay (HOSPITAL_COMMUNITY): Payer: BLUE CROSS/BLUE SHIELD

## 2019-08-03 DIAGNOSIS — K659 Peritonitis, unspecified: Secondary | ICD-10-CM | POA: Diagnosis not present

## 2019-08-03 DIAGNOSIS — K56609 Unspecified intestinal obstruction, unspecified as to partial versus complete obstruction: Secondary | ICD-10-CM

## 2019-08-03 DIAGNOSIS — R7989 Other specified abnormal findings of blood chemistry: Secondary | ICD-10-CM | POA: Diagnosis not present

## 2019-08-03 DIAGNOSIS — K352 Acute appendicitis with generalized peritonitis, without abscess: Secondary | ICD-10-CM | POA: Diagnosis not present

## 2019-08-03 LAB — BASIC METABOLIC PANEL
Anion gap: 11 (ref 5–15)
BUN: 32 mg/dL — ABNORMAL HIGH (ref 6–20)
CO2: 20 mmol/L — ABNORMAL LOW (ref 22–32)
Calcium: 8.3 mg/dL — ABNORMAL LOW (ref 8.9–10.3)
Chloride: 106 mmol/L (ref 98–111)
Creatinine, Ser: 2.01 mg/dL — ABNORMAL HIGH (ref 0.61–1.24)
GFR calc Af Amer: 41 mL/min — ABNORMAL LOW (ref >60)
GFR calc non Af Amer: 36 mL/min — ABNORMAL LOW (ref >60)
Glucose, Bld: 110 mg/dL — ABNORMAL HIGH (ref 70–99)
Potassium: 4.2 mmol/L (ref 3.5–5.1)
Sodium: 137 mmol/L (ref 135–145)

## 2019-08-03 LAB — MAGNESIUM: Magnesium: 2.7 mg/dL — ABNORMAL HIGH (ref 1.7–2.4)

## 2019-08-03 MED ORDER — ACETAMINOPHEN 10 MG/ML IV SOLN
1000.0000 mg | Freq: Three times a day (TID) | INTRAVENOUS | Status: AC
  Administered 2019-08-03 – 2019-08-04 (×3): 1000 mg via INTRAVENOUS
  Filled 2019-08-03 (×4): qty 100

## 2019-08-03 MED ORDER — DIATRIZOATE MEGLUMINE & SODIUM 66-10 % PO SOLN
90.0000 mL | Freq: Once | ORAL | Status: AC
  Administered 2019-08-03: 90 mL via NASOGASTRIC
  Filled 2019-08-03: qty 90

## 2019-08-03 NOTE — Progress Notes (Signed)
PROGRESS NOTE  Kent Kelly YQI:[id number] DOB: Dec 08, 1960 DOA: 07/30/2019 PCP: [facility]  Brief History   Kent Kelly is an 58 y.o. 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  At the time of admission his creatinine was 2.59 on 07/29/2021.  Now his creatinine is down to 2.21 with IV hydration.  Patient does not know what his baseline creatinine is.  His blood pressure has been up and down throughout the hospital stay.  He has not received any IV contrast nor he is on any nephrotoxic agents.  His I's and O's indicate he is positive by 4 L.  His urine output in the last 24 hours 2000 cc.  He denies having a bowel movement or flatus.  However he is belching.  Denies vomiting or nausea. Contact information to [facility] nephrology team was shared with consulting hospitalist in order to obtain more detailed information about his renal status. The patient has not seen a doctor since 2017. At that time his creatinine was 1.68.  Pt had NGT placed on 08/03/2019 due to apparent Ileus vs Obstruction post-operatively.  Consultants  . Hospitalists  Procedures  . Laparoscopic appendectomy  Antibiotics   Anti-infectives (From admission, onward)   Start     Dose/Rate Route Frequency Ordered Stop   07/31/19 1314  piperacillin-tazobactam (ZOSYN) 3.375 GM/50ML IVPB    Note to Pharmacy: Jarvis Newcomer   : cabinet override      07/31/19 1314 07/31/19 1317   07/30/19 2315  piperacillin-tazobactam (ZOSYN) IVPB 3.375 g     3.375 g 12.5 mL/hr over 240 Minutes Intravenous Every 8 hours 07/30/19 2300     07/30/19 1815  piperacillin-tazobactam (ZOSYN) IVPB 3.375 g     3.375 g 100 mL/hr over 30 Minutes Intravenous  Once 07/30/19 1808 07/30/19 2035     Interval History/Subjective  The patient states that he is very uncomfortable. He had emesis x 1 yesterday and some loose BM's, but this  morning he is much more distended and nauseated..  Objective   Vitals:  Vitals:   08/02/19 2148 08/03/19 0522  BP: (!) 136/96 (!) 148/96  Pulse: 93 78  Resp: 20 18  Temp: 98.7 F (37.1 C) 97.8 F (36.6 C)  SpO2: 100% 100%   Exam:  Constitutional:  . The patient is awake, alert, and oriented x 3. Moderate distress from abdominal discomfort. Respiratory:  . No increased work of breathing. . No wheezes, rales, or rhonchi . No tactile fremitus Cardiovascular:  . Regular rate and rhythm . No murmurs, ectopy, or gallups. . No lateral PMI. No thrills. Abdomen:  . Abdomen is distended, tender, and taut. . No hernias, masses, or organomegaly . Hypoactive bowel sounds.  Musculoskeletal:  . No cyanosis, clubbing, or edema Skin:  . No rashes, lesions, ulcers . palpation of skin: no induration or nodules Neurologic:  . CN 2-12 intact . Sensation all 4 extremities intact Psychiatric:  . Mental status o Mood, affect appropriate o Orientation to person, place, time  . judgment and insight appear intact  I have personally reviewed the following:   Today's Data  . BMP . Abdominal x-ray: Gaseous distention of small bowel, small bowel obstruction  Lab Data  . Renal ultrasound: Normal right kidney; surgically absent left kidney   Scheduled Meds: . acetaminophen  1,000 mg Oral Q8H  . heparin injection (subcutaneous)  5,000 Units Subcutaneous Q8H   Continuous Infusions: .  sodium chloride 75 mL/hr at 08/03/19 1215  . piperacillin-tazobactam (ZOSYN)  IV 3.375 g (08/03/19 4097)    Active Problems:   Appendicitis   Acute appendicitis   Elevated serum creatinine   LOS: 4 days   A & P  AKI in a patient with single kidney: The patient donated his left kidney to his wife in 2015. The last time that he saw a doctor prior to this admission was 2017. His creatinine at that time was 1.68. His creatinine is trending in the right direction with IV hydration. It is 2.14 this  morning. Continue IV hydration. Patient not able to have normal p.o. intake due to recent surgery. Renal ultrasound demonstrates a normal right kidney and a surgically absent left kidney. continue to monitor creatinine.  If his creatinine is trending in the right direction, he can follow-up with his PCP as an outpatient. The patient will need to follow up with PCP and nephrologist on discharge.  Small bowel obstruction vs post-op ileus: NGT placed. As per surgery.  Hypertension: The patient's blood pressure has been up and down since admission.  He denies her having a history of hypertension nor he is on any medications at home for it. Of course, he has not seen a doctor in 3 years. Monitor. Continue IV Lopressor while in hospital.  If the blood pressure trends to run high then will consider adding antihypertensives at discharge.  Status post laparoscopic appendectomy -patient denies passing gas or having a bowel movement abdomen distended.  Per surgery. Patient states that he has had some flatus today.  I have seen and examined this patient myself. I have spent 30 minutes in his evaluation and care.  Kent Rasheed, DO [facility] Direct contact: see [email or web address]  7PM-7AM contact night coverage as above 08/03/2019, 2:00 PM  LOS: 3 days

## 2019-08-03 NOTE — Progress Notes (Signed)
Pharmacy Antibiotic Note  Kent Kelly is a 58 y.o. male admitted on 07/30/2019 with acute supprative appendicitis with generalized peritonitis.  Pharmacy has been consulted for zosyn dosing.  08/03/2019 Scr 2.01, CrCl ~ 54mls/min WBC 8.2   Plan: Continue Zosyn 3.375g IV Q8H infused over 4hrs  Pharmacy will sign off and follow peripherally  Height: 5\' 8"  (172.7 cm) Weight: 192 lb (87.1 kg) IBW/kg (Calculated) : 68.4  Temp (24hrs), Avg:98.3 F (36.8 C), Min:97.8 F (36.6 C), Max:98.7 F (37.1 C)  Recent Labs  Lab 07/30/19 1338 07/31/19 0554 07/31/19 0826 07/31/19 1126 08/01/19 0341 08/02/19 0323 08/03/19 0427  WBC 10.9* 8.4  --   --  8.1 8.2  --   CREATININE 2.56*  --  2.59* 2.40* 2.21* 2.14* 2.01*    Estimated Creatinine Clearance: 43 mL/min (A) (by C-G formula based on SCr of 2.01 mg/dL (H)).    Allergies  Allergen Reactions  . Pollen Extract     Antimicrobials this admission: Zosyn 07/30/2019 >>   Dose adjustments this admission: -  Microbiology results: -  Thank you for allowing pharmacy to be a part of this patient's care.  Angela Adam 08/03/2019 10:13 AM

## 2019-08-03 NOTE — Progress Notes (Signed)
3 Days Post-Op    CC: Abdominal pain  Subjective: Pt had a few stools, but threw up once yesterday and is very distended and uncomfortable.  No desire to eat/drink other than sips of water.    Objective: Vital signs in last 24 hours: Temp:  [97.8 F (36.6 C)-98.7 F (37.1 C)] 97.8 F (36.6 C) (10/24 0522) Pulse Rate:  [78-93] 78 (10/24 0522) Resp:  [16-20] 18 (10/24 0522) BP: (136-153)/(96-104) 148/96 (10/24 0522) SpO2:  [100 %] 100 % (10/24 0522) Last BM Date: 08/02/19  Intake/Output from previous day: 10/23 0701 - 10/24 0700 In: 2533 [P.O.:720; I.V.:1666.4; IV Piggyback:146.6] Out: 2700 [Urine:2700] Intake/Output this shift: No intake/output data recorded.  General appearance: alert, looks uncomfortable  Resp: breathing comfortably GI: much more distended than yesterday.  Lab Results:  Recent Labs    08/01/19 0341 08/02/19 0323  WBC 8.1 8.2  HGB 11.5* 11.9*  HCT 35.7* 37.0*  PLT 231 279    BMET Recent Labs    08/02/19 0323 08/03/19 0427  NA 136 137  K 4.1 4.2  CL 105 106  CO2 21* 20*  GLUCOSE 114* 110*  BUN 29* 32*  CREATININE 2.14* 2.01*  CALCIUM 8.0* 8.3*   PT/INR No results for input(s): LABPROT, INR in the last 72 hours.  Recent Labs  Lab 07/30/19 1338  AST 17  ALT 16  ALKPHOS 57  BILITOT 1.2  PROT 8.6*  ALBUMIN 3.6     Lipase     Component Value Date/Time   LIPASE 21 07/30/2019 1338     Medications: . acetaminophen  1,000 mg Oral Q8H  . heparin injection (subcutaneous)  5,000 Units Subcutaneous Q8H   ABDOMEN - 1 VIEW 08/02/19  COMPARISON:  CT 07/30/2019  FINDINGS: Lung bases are clear. Worsened small bowel distension with increased number and caliber of distended small bowel loops measuring up to 5.9 cm. No radiopaque calculi.  IMPRESSION: Worsening small bowel distension, suspicious for a bowel obstruction   Assessment/Plan AKI- 2.56 >>2.59>>2.40>>2.21>>2.14(10/23) - single kidney(prior kidney  donation) COVID NEGATIVE  Acute supprative appendicitis with generalized peritonitis.  Small perforation seen on pathology. Laparoscopic appendectomy 07/31/2019 Dr. Stark Klein  - post op ileus not unexpected Given distention and discomfort, will get NGT placed.    FEN: IV fluids/clear liquids ID: Zosyn 10/20 >> day 4/7 DVT: Restart heparin this afternoon Follow-up: [facility] clinic   LOS: 4 days     Milus Height, MD FACS Surgical Oncology, General Surgery, Trauma and [place] [facility], [place] [phone] Check [email or web address] for coverage night/weekend

## 2019-08-04 ENCOUNTER — Inpatient Hospital Stay (HOSPITAL_COMMUNITY): Payer: BLUE CROSS/BLUE SHIELD

## 2019-08-04 DIAGNOSIS — K659 Peritonitis, unspecified: Secondary | ICD-10-CM | POA: Diagnosis not present

## 2019-08-04 DIAGNOSIS — R7989 Other specified abnormal findings of blood chemistry: Secondary | ICD-10-CM | POA: Diagnosis not present

## 2019-08-04 DIAGNOSIS — K56609 Unspecified intestinal obstruction, unspecified as to partial versus complete obstruction: Secondary | ICD-10-CM | POA: Diagnosis not present

## 2019-08-04 DIAGNOSIS — K352 Acute appendicitis with generalized peritonitis, without abscess: Secondary | ICD-10-CM | POA: Diagnosis not present

## 2019-08-04 LAB — CBC WITH DIFFERENTIAL/PLATELET
Abs Immature Granulocytes: 0.14 10*3/uL — ABNORMAL HIGH (ref 0.00–0.07)
Basophils Absolute: 0 10*3/uL (ref 0.0–0.1)
Basophils Relative: 0 %
Eosinophils Absolute: 0 10*3/uL (ref 0.0–0.5)
Eosinophils Relative: 1 %
HCT: 35.6 % — ABNORMAL LOW (ref 39.0–52.0)
Hemoglobin: 11.6 g/dL — ABNORMAL LOW (ref 13.0–17.0)
Immature Granulocytes: 2 %
Lymphocytes Relative: 14 %
Lymphs Abs: 1.1 10*3/uL (ref 0.7–4.0)
MCH: 22.6 pg — ABNORMAL LOW (ref 26.0–34.0)
MCHC: 32.6 g/dL (ref 30.0–36.0)
MCV: 69.3 fL — ABNORMAL LOW (ref 80.0–100.0)
Monocytes Absolute: 0.8 10*3/uL (ref 0.1–1.0)
Monocytes Relative: 10 %
Neutro Abs: 6.1 10*3/uL (ref 1.7–7.7)
Neutrophils Relative %: 73 %
Platelets: 329 10*3/uL (ref 150–400)
RBC: 5.14 MIL/uL (ref 4.22–5.81)
RDW: 14.6 % (ref 11.5–15.5)
WBC: 8.3 10*3/uL (ref 4.0–10.5)
nRBC: 0 % (ref 0.0–0.2)

## 2019-08-04 LAB — COMPREHENSIVE METABOLIC PANEL
ALT: 15 U/L (ref 0–44)
AST: 16 U/L (ref 15–41)
Albumin: 2.7 g/dL — ABNORMAL LOW (ref 3.5–5.0)
Alkaline Phosphatase: 55 U/L (ref 38–126)
Anion gap: 11 (ref 5–15)
BUN: 35 mg/dL — ABNORMAL HIGH (ref 6–20)
CO2: 22 mmol/L (ref 22–32)
Calcium: 8.3 mg/dL — ABNORMAL LOW (ref 8.9–10.3)
Chloride: 106 mmol/L (ref 98–111)
Creatinine, Ser: 2.02 mg/dL — ABNORMAL HIGH (ref 0.61–1.24)
GFR calc Af Amer: 41 mL/min — ABNORMAL LOW (ref >60)
GFR calc non Af Amer: 35 mL/min — ABNORMAL LOW (ref >60)
Glucose, Bld: 107 mg/dL — ABNORMAL HIGH (ref 70–99)
Potassium: 3.8 mmol/L (ref 3.5–5.1)
Sodium: 139 mmol/L (ref 135–145)
Total Bilirubin: 0.7 mg/dL (ref 0.3–1.2)
Total Protein: 6.7 g/dL (ref 6.5–8.1)

## 2019-08-04 MED ORDER — METOPROLOL TARTRATE 50 MG PO TABS
50.0000 mg | ORAL_TABLET | Freq: Two times a day (BID) | ORAL | Status: DC
  Administered 2019-08-04 – 2019-08-07 (×8): 50 mg via ORAL
  Filled 2019-08-04 (×9): qty 1

## 2019-08-04 MED ORDER — METOPROLOL TARTRATE 5 MG/5ML IV SOLN: 5.0000 mg | Freq: Four times a day (QID) | INTRAVENOUS | Status: DC | PRN

## 2019-08-04 NOTE — Progress Notes (Signed)
PROGRESS NOTE  Kent Kelly:[id number] DOB: January 28, 1961 DOA: 07/30/2019 PCP: [facility]  Brief History   Kent Kelly is an 58 y.o. 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  At the time of admission his creatinine was 2.59 on 07/29/2021.  Now his creatinine is down to 2.21 with IV hydration.  Patient does not know what his baseline creatinine is.  His blood pressure has been up and down throughout the hospital stay.  He has not received any IV contrast nor he is on any nephrotoxic agents.  His I's and O's indicate he is positive by 4 L.  His urine output in the last 24 hours 2000 cc.  He denies having a bowel movement or flatus.  However he is belching.  Denies vomiting or nausea. Contact information to [facility] nephrology team was shared with consulting hospitalist in order to obtain more detailed information about his renal status. The patient has not seen a doctor since 2017. At that time his creatinine was 1.68.  Pt had NGT placed on 08/03/2019 due to apparent Ileus vs Obstruction post-operatively. He is feeling better today.  Consultants  . Hospitalists  Procedures  . Laparoscopic appendectomy  Antibiotics   Anti-infectives (From admission, onward)   Start     Dose/Rate Route Frequency Ordered Stop   07/31/19 1314  piperacillin-tazobactam (ZOSYN) 3.375 GM/50ML IVPB    Note to Pharmacy: Kent Kelly   : cabinet override      07/31/19 1314 07/31/19 1317   07/30/19 2315  piperacillin-tazobactam (ZOSYN) IVPB 3.375 g     3.375 g 12.5 mL/hr over 240 Minutes Intravenous Every 8 hours 07/30/19 2300     07/30/19 1815  piperacillin-tazobactam (ZOSYN) IVPB 3.375 g     3.375 g 100 mL/hr over 30 Minutes Intravenous  Once 07/30/19 1808 07/30/19 2035     Interval History/Subjective  The patient states that he is feeling better. He is now tolerating clears.  Passing flatus.  Objective   Vitals:  Vitals:   08/04/19 0530 08/04/19 1417  BP: (!) 160/100 (!) 167/93  Pulse: 85 65  Resp: 18 18  Temp: 98.6 F (37 C) 97.9 F (36.6 C)  SpO2: 100% 100%   Exam:  Constitutional:  The patient is awake, alert, and oriented x 3. No acute distress. Respiratory:  . No increased work of breathing. . No wheezes, rales, or rhonchi . No tactile fremitus Cardiovascular:  . Regular rate and rhythm . No murmurs, ectopy, or gallups. . No lateral PMI. No thrills. Abdomen:  . Abdomen is distended, but no longer taut. Non-tender. . No hernias, masses, or organomegaly . Hypoactive bowel sounds.  Musculoskeletal:  . No cyanosis, clubbing, or edema Skin:  . No rashes, lesions, ulcers . palpation of skin: no induration or nodules Neurologic:  . CN 2-12 intact . Sensation all 4 extremities intact Psychiatric:  . Mental status o Mood, affect appropriate o Orientation to person, place, time  . judgment and insight appear intact  I have personally reviewed the following:   Today's Data  . BMP . Abdominal x-ray: Small decree of small bowel obstruction. No free air.   Lab Data  . Renal ultrasound: Normal right kidney; surgically absent left kidney   Scheduled Meds: . heparin injection (subcutaneous)  5,000 Units Subcutaneous Q8H   Continuous Infusions: . sodium chloride 100 mL/hr at 08/04/19 0932  . 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 The last time that he saw a doctor prior to this admission was 2017. His creatinine at that time was 1.68. His creatinine is trending in the right direction with IV hydration. It is 2.02 this morning. Continue IV hydration. Patient not able to have normal p.o. intake due to recent  surgery. Renal ultrasound demonstrates a normal right kidney and a surgically absent left kidney. continue to monitor creatinine.  If his creatinine is trending in the right direction, he can follow-up with his PCP as an outpatient. The patient will need to follow up with PCP and nephrologist on discharge.  Small bowel obstruction vs post-op ileus: NGT placed. X-ray is improved, and the patient states that he is more comfortably. He is passing flatus. Plan is to remove NGT after lunch. Monitor.   Hypertension: The patient's blood pressure has been up and down since admission.  He denies her having a history of hypertension nor he is on any medications at home for it. Of course, he has not seen a doctor in 3 years. Monitor. Continue IV Lopressor while in hospital.  If the blood pressure trends to run high then will consider adding antihypertensives at discharge. Start oral metoprolol. Monitor.  Status post laparoscopic appendectomy -patient denies passing gas or having a bowel movement abdomen distended.  Per surgery. Patient states that he has had some flatus today.  I have seen and examined this patient myself. I have spent 34 minutes in his evaluation and care.  Kent Rosensteel, DO [facility] Direct contact: see [email or web address]  7PM-7AM contact night coverage as above 08/04/2019, 2:33 PM  LOS: 3 days

## 2019-08-04 NOTE — Progress Notes (Signed)
4 Days Post-Op    CC: Abdominal pain  Subjective: NGT inserted yesterday and patient has had significant relief.  Is passing gas now.    Objective: Vital signs in last 24 hours: Temp:  [98.6 F (37 C)-99.4 F (37.4 C)] 98.6 F (37 C) (10/25 0530) Pulse Rate:  [78-97] 85 (10/25 0530) Resp:  [16-18] 18 (10/25 0530) BP: (150-164)/(93-100) 160/100 (10/25 0530) SpO2:  [100 %] 100 % (10/25 0530) Last BM Date: 08/02/19  Intake/Output from previous day: 10/24 0701 - 10/25 0700 In: 2014.8 [I.V.:1759.4; IV Piggyback:255.5] Out: 5300 [Urine:2300; Emesis/NG output:3000] Intake/Output this shift: Total I/O In: -  Out: 850 [Urine:300; Emesis/NG output:550]  General appearance: alert, looks much better.   Resp: breathing comfortably GI: improved.  Soft, sl distended still .  Lab Results:  Recent Labs    08/02/19 0323 08/04/19 0338  WBC 8.2 8.3  HGB 11.9* 11.6*  HCT 37.0* 35.6*  PLT 279 329    BMET Recent Labs    08/03/19 0427 08/04/19 0338  NA 137 139  K 4.2 3.8  CL 106 106  CO2 20* 22  GLUCOSE 110* 107*  BUN 32* 35*  CREATININE 2.01* 2.02*  CALCIUM 8.3* 8.3*   PT/INR No results for input(s): LABPROT, INR in the last 72 hours.  Recent Labs  Lab 07/30/19 1338 08/04/19 0338  AST 17 16  ALT 16 15  ALKPHOS 57 55  BILITOT 1.2 0.7  PROT 8.6* 6.7  ALBUMIN 3.6 2.7*     Lipase     Component Value Date/Time   LIPASE 21 07/30/2019 1338     Medications: . heparin injection (subcutaneous)  5,000 Units Subcutaneous Q8H   ABDOMEN - 1 VIEW 08/02/19  COMPARISON:  CT 07/30/2019  FINDINGS: Lung bases are clear. Worsened small bowel distension with increased number and caliber of distended small bowel loops measuring up to 5.9 cm. No radiopaque calculi.  IMPRESSION: Worsening small bowel distension, suspicious for a bowel obstruction   Assessment/Plan AKI- 2.56 >>2.59>>2.40>>2.21>>2.14(10/23) - single kidney(prior kidney donation) COVID  NEGATIVE  Acute supprative appendicitis with generalized peritonitis.  Small perforation seen on pathology. Laparoscopic appendectomy 07/31/2019 Dr. Stark Klein  - post op ileus not unexpected Improving.  Will d/c NGT at noon and try clears again.       FEN: IV fluids/clear liquids ID: Zosyn 10/20 >> day 5/7 DVT: sq heparin.   Follow-up: [facility] clinic   LOS: 5 days     Milus Height, MD FACS Surgical Oncology, General Surgery, Trauma and [place] [facility], [email or web address] for coverage night/weekend

## 2019-08-05 ENCOUNTER — Encounter (HOSPITAL_COMMUNITY): Payer: Self-pay | Admitting: Radiology

## 2019-08-05 ENCOUNTER — Inpatient Hospital Stay (HOSPITAL_COMMUNITY): Payer: BLUE CROSS/BLUE SHIELD

## 2019-08-05 DIAGNOSIS — K3580 Unspecified acute appendicitis: Secondary | ICD-10-CM | POA: Diagnosis not present

## 2019-08-05 LAB — BASIC METABOLIC PANEL
Anion gap: 10 (ref 5–15)
BUN: 29 mg/dL — ABNORMAL HIGH (ref 6–20)
CO2: 21 mmol/L — ABNORMAL LOW (ref 22–32)
Calcium: 8.2 mg/dL — ABNORMAL LOW (ref 8.9–10.3)
Chloride: 107 mmol/L (ref 98–111)
Creatinine, Ser: 1.78 mg/dL — ABNORMAL HIGH (ref 0.61–1.24)
GFR calc Af Amer: 48 mL/min — ABNORMAL LOW (ref >60)
GFR calc non Af Amer: 41 mL/min — ABNORMAL LOW (ref >60)
Glucose, Bld: 104 mg/dL — ABNORMAL HIGH (ref 70–99)
Potassium: 4 mmol/L (ref 3.5–5.1)
Sodium: 138 mmol/L (ref 135–145)

## 2019-08-05 MED ORDER — HYDROCORTISONE (PERIANAL) 2.5 % EX CREA
1.0000 "application " | TOPICAL_CREAM | Freq: Four times a day (QID) | CUTANEOUS | Status: DC | PRN
  Filled 2019-08-05: qty 28.35

## 2019-08-05 MED ORDER — MAGIC MOUTHWASH
15.0000 mL | Freq: Four times a day (QID) | ORAL | Status: DC | PRN
  Filled 2019-08-05: qty 15

## 2019-08-05 MED ORDER — LACTATED RINGERS IV BOLUS: 1000.0000 mL | Freq: Three times a day (TID) | INTRAVENOUS | Status: AC | PRN

## 2019-08-05 MED ORDER — SODIUM CHLORIDE (PF) 0.9 % IJ SOLN
INTRAMUSCULAR | Status: AC
  Filled 2019-08-05: qty 50

## 2019-08-05 MED ORDER — LIP MEDEX EX OINT
1.0000 "application " | TOPICAL_OINTMENT | Freq: Two times a day (BID) | CUTANEOUS | Status: DC
  Administered 2019-08-05 – 2019-08-07 (×6): 1 via TOPICAL
  Filled 2019-08-05: qty 7

## 2019-08-05 MED ORDER — AMLODIPINE BESYLATE 5 MG PO TABS
5.0000 mg | ORAL_TABLET | Freq: Every day | ORAL | Status: DC
  Administered 2019-08-05 – 2019-08-06 (×2): 5 mg via ORAL
  Filled 2019-08-05 (×2): qty 1

## 2019-08-05 MED ORDER — ALUM & MAG HYDROXIDE-SIMETH 200-200-20 MG/5ML PO SUSP: 30.0000 mL | Freq: Four times a day (QID) | ORAL | Status: DC | PRN

## 2019-08-05 MED ORDER — METOPROLOL TARTRATE 5 MG/5ML IV SOLN: 5.0000 mg | Freq: Four times a day (QID) | INTRAVENOUS | Status: DC | PRN

## 2019-08-05 MED ORDER — MENTHOL 3 MG MT LOZG: 1.0000 | LOZENGE | OROMUCOSAL | Status: DC | PRN

## 2019-08-05 MED ORDER — GUAIFENESIN-DM 100-10 MG/5ML PO SYRP: 10.0000 mL | ORAL_SOLUTION | ORAL | Status: DC | PRN

## 2019-08-05 MED ORDER — BISACODYL 10 MG RE SUPP: 10.0000 mg | Freq: Two times a day (BID) | RECTAL | Status: DC | PRN

## 2019-08-05 MED ORDER — SACCHAROMYCES BOULARDII 250 MG PO CAPS
250.0000 mg | ORAL_CAPSULE | Freq: Two times a day (BID) | ORAL | Status: DC
  Administered 2019-08-05 – 2019-08-07 (×6): 250 mg via ORAL
  Filled 2019-08-05 (×7): qty 1

## 2019-08-05 MED ORDER — ENALAPRILAT 1.25 MG/ML IV SOLN
0.6250 mg | Freq: Four times a day (QID) | INTRAVENOUS | Status: DC | PRN
  Filled 2019-08-05: qty 1

## 2019-08-05 MED ORDER — LORATADINE 10 MG PO TABS
10.0000 mg | ORAL_TABLET | Freq: Every day | ORAL | Status: DC
  Administered 2019-08-05 – 2019-08-07 (×3): 10 mg via ORAL
  Filled 2019-08-05 (×4): qty 1

## 2019-08-05 MED ORDER — IOHEXOL 300 MG/ML  SOLN
100.0000 mL | Freq: Once | INTRAMUSCULAR | Status: AC | PRN
  Administered 2019-08-05: 100 mL via INTRAVENOUS

## 2019-08-05 MED ORDER — IOHEXOL 300 MG/ML  SOLN
30.0000 mL | Freq: Once | INTRAMUSCULAR | Status: AC | PRN
  Administered 2019-08-05: 30 mL via ORAL

## 2019-08-05 MED ORDER — HYDROCORTISONE 1 % EX CREA
1.0000 "application " | TOPICAL_CREAM | Freq: Three times a day (TID) | CUTANEOUS | Status: DC | PRN
  Filled 2019-08-05: qty 28

## 2019-08-05 MED ORDER — PHENOL 1.4 % MT LIQD
1.0000 | OROMUCOSAL | Status: DC | PRN
  Filled 2019-08-05: qty 177

## 2019-08-05 NOTE — Progress Notes (Signed)
5 Days Post-Op    CC: Abdominal pain  Subjective: He is still fairly distended.  He says he feels good.  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  He is having multiple loose stools.  I showed him a plain film from yesterday that small bowel was measuring up to 5 cm.  Objective: Vital signs in last 24 hours: Temp:  [97.9 F (36.6 C)-99.1 F (37.3 C)] 98.2 F (36.8 C) (10/26 2992) Pulse Rate:  [65-85] 85 (10/26 0638) Resp:  [18] 18 (10/26 4268) BP: (151-167)/(93-103) 151/99 (10/26 0638) SpO2:  [100 %] 100 % (10/26 3419) Last BM Date: 08/04/19 240 PO recorded 1300 IV 600 urine recorded NG 850 Stools x 9 Afebrile, BP is elevated, (HR/RR OK) Creatinine  1.78 K+ 4.0 GFR 48 Intake/Output from previous day: 10/25 0701 - 10/26 0700 In: 1513.5 [P.O.:240; I.V.:979; IV Piggyback:294.5] Out: 1450 [Urine:600; Emesis/NG output:850] Intake/Output this shift: Total I/O In: 240 [P.O.:240] Out: -   General appearance: alert, cooperative, no distress and Anxious to get out of the hospital. Resp: clear to auscultation bilaterally GI: Extremely distended, still has high-pitched bowel sounds.  Positive BMs.  Port sites okay.  Lab Results:  Recent Labs    08/04/19 0338  WBC 8.3  HGB 11.6*  HCT 35.6*  PLT 329    BMET Recent Labs    08/04/19 0338 08/05/19 0314  NA 139 138  K 3.8 4.0  CL 106 107  CO2 22 21*  GLUCOSE 107* 104*  BUN 35* 29*  CREATININE 2.02* 1.78*  CALCIUM 8.3* 8.2*   PT/INR No results for input(s): LABPROT, INR in the last 72 hours.  Recent Labs  Lab 07/30/19 1338 08/04/19 0338  AST 17 16  ALT 16 15  ALKPHOS 57 55  BILITOT 1.2 0.7  PROT 8.6* 6.7  ALBUMIN 3.6 2.7*     Lipase     Component Value Date/Time   LIPASE 21 07/30/2019 1338     Medications: . heparin injection (subcutaneous)  5,000 Units Subcutaneous Q8H  . metoprolol tartrate  50 mg Oral BID   . sodium chloride 100 mL/hr at 08/04/19 0932  .  piperacillin-tazobactam (ZOSYN)  IV 3.375 g (08/05/19 0542)    Assessment/Plan AKI- 2.56 >>2.59>>2.40>>2.21>>2.14(10/23)>>1.78(10/26) - single kidney(prior kidney donation) COVID NEGATIVE  Acute supprative appendicitis with generalized peritonitis Laparoscopic appendectomy 07/31/2019 Dr. Stark Klein  - post op ileus>>NG 10/24>> NG out 10/25  FEN: IV fluids/clear liquids ID: Zosyn 10/20>>day 6/7 DVT: Restart heparin this afternoon Follow-up: [facility]   Plan: I am going to advance him to full liquids and see how he does. Decrease IV fluids. He is up walking and doing all he can with mobility.      LOS: 6 days    Israa Caban 08/05/2019 Please see Amion

## 2019-08-05 NOTE — Progress Notes (Signed)
SURGICAL PATHOLOGY  CASE: [id number]  PATIENT: Kent Kelly  Surgical Pathology Report      Clinical History: Acute appendicitis (cm)      FINAL MICROSCOPIC DIAGNOSIS:   A. APPENDIX, APPENDECTOMY:  - Acute appendicitis with perforation      Braiden Presutti DESCRIPTION:   Specimen: Received fresh  Size: Vermiform appendix measuring 7.9 cm in length and up to 1.1 cm in  diameter  Serosa: Tan-red, hyperemic, with a possible perforation noted at the  distal tip. The tip is surrounded by moderate amount of tan-gray  purulent material.  Mucosa: Tan-pink and hyperemic  Wall: Up to 0.2 cm in thickness  Lumen: Lumen is mild to moderately dilated, and filled with a moderate  amount of tan purulent material in the 1.0 cm tan-brown firm fecalith.  A possible abscess cavity is noted at the distal tip of the specimen,  without a grossly identifiable perforation noted.  Block Summary:  2 blocks submitted, with resection margin inked black Craig Staggers 08/01/2019)      Final Diagnosis performed by Jaquita Folds, MD.  Electronically  signed 08/01/2019

## 2019-08-05 NOTE — Progress Notes (Signed)
Kent Kelly called to make me aware that patient should be going to CT in thirty to forty five minutes, an hour at most. Informed her that [facility] [place] would like to have CT done ASAP while patient retains contrast. Patient made aware of update. Donne Hazel, RN

## 2019-08-05 NOTE — Progress Notes (Addendum)
Call to CT to let them know patient has a new PIV. Spoke with Lovena Le who requested I call back in five minutes, as they have two patients in process now. Donne Hazel, RN

## 2019-08-05 NOTE — Progress Notes (Signed)
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  At the time of admission his creatinine was 2.59 on 07/29/2021. Now his creatinine is down to 2.21 with IV hydration. Patient does not know what his baseline creatinine is. The patient has not seen a doctor since 2017, at that time his creatinine was 1.68.  Hospital service was consulted by general surgery for assistance with medical management in terms of his renal failure.   Assessment & Plan:   Principal Problem:   Suppurative appendicitis s/p lap appendectomy 07/31/2019 Active Problems:   Elevated serum creatinine  Acute suppurative appendicitis with perforation Patient initially presented with right lower quadrant abdominal pain was found to have acute appendicitis.  Underwent laparoscopic appendectomy on 07/31/2019 by Dr. Donell BeersByerly.  Pathology notable for acute appendicitis with perforation.  Patient reports passing flatus with small bowel movements. --KUB 08/04/2019 notable for findings of small bowel obstruction, no free air. 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  Has not seen a physician in over 2 years.  Last creatinine 1.68 in 2017 with a GFR of 54.  High of 2.59 during hospitalization, likely secondary to underlying sepsis from appendicitis with perforation as above.  None with normal appearance/thickness/echogenicity without lesions or hydronephrosis on right kidney and known surgically absent left kidney. --Creatinine  down to 1.78 today --Continue to monitor creatinine closely especially in the setting of IV antibiotics with Zosyn and repeat CT abdomen/pelvis with IV contrast today --Continue IV fluids with NS at 50 mL's per hour until diet further advanced --Avoid nephrotoxins, renally dose all medications --Continue to monitor strict I's and O's, especially urine output --Will need close follow-up with PCP and nephrology following discharge.  Elevated blood pressure Patient denies history of hypertension.  Has not seen a physician for several years. --BP 151/99 this morning --Continue metoprolol tartrate 50 mg p.o. twice daily --Start amlodipine 5 mg p.o. daily --Metoprolol 5 mg IV every 6 hours prn --Vasotec IV q6hrs prn; would try to avoid given his renal dysfunction if possible   DVT prophylaxis: Heparin Code Status: Full code Family Communication: Discussed with family present at bedside Disposition Plan: Per primary, general surgery   Procedures:   Laparoscopic appendectomy, 07/31/2019, Dr. Donell BeersByerly  Antimicrobials:   Zosyn 10/20>>>   Subjective: Patient seen and examined at bedside, sitting in bedside chair.  Eating grits.  Frustrated that he still in the hospital and thinks that this has slowly progressed following operative management.  Family present at bedside as well.  Surgery concerned given his abdominal distention and findings on KUB of possible small bowel obstruction that there may be underlying abscess.  Pending repeat CT abdomen/pelvis today.  Otherwise, renal function continues to improve.  Unclear actual baseline; given no labs since 2017.  No other complaints or concerns at this time.  Denies headache, no fever/chills/night sweats, no chest pain, no palpitations, no shortness of breath, no fatigue, no weakness, no paresthesias.  No acute events overnight per nursing staff.  Objective: Vitals:   08/04/19 1417 08/04/19 2153 08/05/19 [id number]  08/05/19 1322  BP: (!) 167/93 (!)  157/103 (!) 151/99 (!) 169/100  Pulse: 65 69 85 65  Resp: 18 18 18 18   Temp: 97.9 F (36.6 C) 99.1 F (37.3 C) 98.2 F (36.8 C) 98.2 F (36.8 C)  TempSrc: Oral Oral Oral Oral  SpO2: 100% 100% 100% 100%  Weight:      Height:        Intake/Output Summary (Last 24 hours) at 08/05/2019 1550 Last data filed at 08/05/2019 1322 Gross per 24 hour  Intake 785.03 ml  Output 300 ml  Net 485.03 ml   Filed Weights   07/30/19 1316  Weight: 87.1 kg    Examination:  General exam: Appears calm and comfortable  Respiratory system: Clear to auscultation. Respiratory effort normal. Cardiovascular system: S1 & S2 heard, RRR. No JVD, murmurs, rubs, gallops or clicks. No pedal edema. Gastrointestinal system: Abdomen distended; soft and nontender. No organomegaly or masses felt. Normal bowel sounds heard. Central nervous system: Alert and oriented. No focal neurological deficits. Extremities: Symmetric 5 x 5 power. Skin: No rashes, lesions or ulcers Psychiatry: Judgement and insight appear normal. Mood & affect appropriate.     Data Reviewed: I have personally reviewed following labs and imaging studies  CBC: Recent Labs  Lab 07/30/19 1338 07/31/19 0554 07/31/19 1126 08/01/19 0341 08/02/19 0323 08/04/19 0338  WBC 10.9* 8.4  --  8.1 8.2 8.3  NEUTROABS  --   --   --   --   --  6.1  HGB 15.3 13.5 15.0 11.5* 11.9* 11.6*  HCT 48.5 42.1 44.0 35.7* 37.0* 35.6*  MCV 73.0* 71.0*  --  71.4* 70.2* 69.3*  PLT 261 264  --  231 279 644   Basic Metabolic Panel: Recent Labs  Lab 07/31/19 0826  08/01/19 0341 08/02/19 0323 08/03/19 0427 08/04/19 0338 08/05/19 0314  NA 134*   < > 133* 136 137 139 138  K 3.9   < > 4.7 4.1 4.2 3.8 4.0  CL 99   < > 103 105 106 106 107  CO2 22  --  21* 21* 20* 22 21*  GLUCOSE 121*   < > 150* 114* 110* 107* 104*  BUN 44*   < > 32* 29* 32* 35* 29*  CREATININE 2.59*   < > 2.21* 2.14* 2.01* 2.02* 1.78*  CALCIUM 8.3*  --  7.3* 8.0* 8.3* 8.3* 8.2*  MG 2.8*  --    --   --  2.7*  --   --    < > = values in this interval not displayed.   GFR: Estimated Creatinine Clearance: 48.6 mL/min (A) (by C-G formula based on SCr of 1.78 mg/dL (H)). Liver Function Tests: Recent Labs  Lab 07/30/19 1338 08/04/19 0338  AST 17 16  ALT 16 15  ALKPHOS 57 55  BILITOT 1.2 0.7  PROT 8.6* 6.7  ALBUMIN 3.6 2.7*   Recent Labs  Lab 07/30/19 1338  LIPASE 21   No results for input(s): AMMONIA in the last 168 hours. Coagulation Profile: No results for input(s): INR, PROTIME in the last 168 hours. Cardiac Enzymes: No results for input(s): CKTOTAL, CKMB, CKMBINDEX, TROPONINI in the last 168 hours. BNP (last 3 results) No results for input(s): PROBNP in the last 8760 hours. HbA1C: No results for input(s): HGBA1C in the last 72 hours. CBG: No results for input(s): GLUCAP in the last 168 hours. Lipid Profile: No results for input(s): CHOL, HDL, LDLCALC, TRIG, CHOLHDL, LDLDIRECT in the last 72 hours. Thyroid  Function Tests: No results for input(s): TSH, T4TOTAL, FREET4, T3FREE, THYROIDAB in the last 72 hours. Anemia Panel: No results for input(s): VITAMINB12, FOLATE, FERRITIN, TIBC, IRON, RETICCTPCT in the last 72 hours. Sepsis Labs: No results for input(s): PROCALCITON, LATICACIDVEN in the last 168 hours.  Recent Results (from the past 240 hour(s))  SARS CORONAVIRUS 2 (TAT 6-24 HRS) Nasopharyngeal Nasopharyngeal Swab     Status: None   Collection Time: 07/30/19  5:49 PM   Specimen: Nasopharyngeal Swab  Result Value Ref Range Status   SARS Coronavirus 2 NEGATIVE NEGATIVE Final    Comment: (NOTE) SARS-CoV-2 target nucleic acids are NOT DETECTED. The SARS-CoV-2 RNA is generally detectable in upper and lower respiratory specimens during the acute phase of infection. Negative results do not preclude SARS-CoV-2 infection, do not rule out co-infections with other pathogens, and should not be used as the sole basis for treatment or other patient management  decisions. Negative results must be combined with clinical observations, patient history, and epidemiological information. The expected result is Negative. Fact Sheet for Patients: HairSlick.no Fact Sheet for Healthcare Providers: quierodirigir.com This test is not yet approved or cleared by the [place] FDA and  has been authorized for detection and/or diagnosis of SARS-CoV-2 by FDA under an Emergency Use Authorization (EUA). This EUA will remain  in effect (meaning this test can be used) for the duration of the COVID-19 declaration under Section 56 4(b)(1) of the Act, 21 U.S.C. section 360bbb-3(b)(1), unless the authorization is terminated or revoked sooner. Performed at [facility], [address] [place], [place] [address]          Radiology Studies: Dg Abd Portable 1v-small Bowel Obstruction Protocol-initial, 8 Hr Delay  Result Date: 08/04/2019 CLINICAL DATA:  Small bowel obstruction EXAM: PORTABLE ABDOMEN - 1 VIEW COMPARISON:  August 03, 2019 FINDINGS: Nasogastric tube tip and side port are in the stomach. There is dilatation of multiple loops of small bowel with apparent narrowing in the distal jejunal region. Very little colonic air seen. No free air. IMPRESSION: Findings indicative of a degree of small bowel obstruction. No free air evident. Nasogastric tube tip and side port in stomach. Comment: Note that oral contrast is not appreciable on this study. Electronically Signed   By: Bretta Bang III M.D.   On: 08/04/2019 11:04        Scheduled Meds: . amLODipine  5 mg Oral Daily  . heparin injection (subcutaneous)  5,000 Units Subcutaneous Q8H  . lip balm  1 application Topical BID  . loratadine  10 mg Oral Daily  . metoprolol tartrate  50 mg Oral BID  . saccharomyces boulardii  250 mg Oral BID  . sodium chloride (PF)       Continuous Infusions: . sodium chloride 50 mL/hr at 08/05/19 1317  .  lactated ringers    . piperacillin-tazobactam (ZOSYN)  IV 3.375 g (08/05/19 1315)     LOS: 6 days    Time spent: 32 minutes spent on chart review, discussion with nursing staff, consultants, updating family and interview/physical exam; more than 50% of that time was spent in counseling and/or coordination of care.    Alvira Philips [place], DO [facility] 08/05/2019, 3:50 PM

## 2019-08-06 DIAGNOSIS — K3580 Unspecified acute appendicitis: Secondary | ICD-10-CM | POA: Diagnosis not present

## 2019-08-06 LAB — BASIC METABOLIC PANEL
Anion gap: 9 (ref 5–15)
BUN: 25 mg/dL — ABNORMAL HIGH (ref 6–20)
CO2: 18 mmol/L — ABNORMAL LOW (ref 22–32)
Calcium: 8.2 mg/dL — ABNORMAL LOW (ref 8.9–10.3)
Chloride: 107 mmol/L (ref 98–111)
Creatinine, Ser: 1.75 mg/dL — ABNORMAL HIGH (ref 0.61–1.24)
GFR calc Af Amer: 49 mL/min — ABNORMAL LOW (ref >60)
GFR calc non Af Amer: 42 mL/min — ABNORMAL LOW (ref >60)
Glucose, Bld: 89 mg/dL (ref 70–99)
Potassium: 3.9 mmol/L (ref 3.5–5.1)
Sodium: 134 mmol/L — ABNORMAL LOW (ref 135–145)

## 2019-08-06 LAB — MAGNESIUM: Magnesium: 2.1 mg/dL (ref 1.7–2.4)

## 2019-08-06 MED ORDER — ACETAMINOPHEN 325 MG PO TABS
650.0000 mg | ORAL_TABLET | Freq: Four times a day (QID) | ORAL | Status: DC | PRN
  Administered 2019-08-06: 650 mg via ORAL
  Filled 2019-08-06: qty 2

## 2019-08-06 MED ORDER — SODIUM CHLORIDE 0.9% FLUSH: 3.0000 mL | INTRAVENOUS | Status: DC | PRN

## 2019-08-06 MED ORDER — SODIUM CHLORIDE 0.9% FLUSH
3.0000 mL | Freq: Two times a day (BID) | INTRAVENOUS | Status: DC
  Administered 2019-08-06 – 2019-08-07 (×3): 3 mL via INTRAVENOUS

## 2019-08-06 MED ORDER — AMLODIPINE BESYLATE 5 MG PO TABS
5.0000 mg | ORAL_TABLET | Freq: Once | ORAL | Status: AC
  Administered 2019-08-06: 5 mg via ORAL
  Filled 2019-08-06: qty 1

## 2019-08-06 MED ORDER — SIMETHICONE 40 MG/0.6ML PO SUSP
40.0000 mg | Freq: Four times a day (QID) | ORAL | Status: DC | PRN
  Filled 2019-08-06: qty 0.6

## 2019-08-06 MED ORDER — AMLODIPINE BESYLATE 10 MG PO TABS
10.0000 mg | ORAL_TABLET | Freq: Every day | ORAL | Status: DC
  Administered 2019-08-07: 10 mg via ORAL
  Filled 2019-08-06 (×2): qty 1

## 2019-08-06 MED ORDER — DIPHENHYDRAMINE HCL 50 MG/ML IJ SOLN: 12.5000 mg | Freq: Four times a day (QID) | INTRAMUSCULAR | Status: DC | PRN

## 2019-08-06 MED ORDER — DIPHENHYDRAMINE HCL 25 MG PO CAPS: 25.0000 mg | ORAL_CAPSULE | Freq: Four times a day (QID) | ORAL | Status: DC | PRN

## 2019-08-06 MED ORDER — SODIUM CHLORIDE 0.9 % IV SOLN: 250.0000 mL | INTRAVENOUS | Status: DC | PRN

## 2019-08-06 MED ORDER — SIMETHICONE 40 MG/0.6ML PO SUSP
40.0000 mg | Freq: Four times a day (QID) | ORAL | Status: AC
  Administered 2019-08-06 – 2019-08-07 (×3): 40 mg via ORAL
  Filled 2019-08-06 (×5): qty 0.6

## 2019-08-06 MED ORDER — BISMUTH SUBSALICYLATE 262 MG PO CHEW
1.0000 | CHEWABLE_TABLET | Freq: Once | ORAL | Status: AC
  Administered 2019-08-06: 262 mg via ORAL
  Filled 2019-08-06: qty 1

## 2019-08-06 MED ORDER — METOCLOPRAMIDE HCL 5 MG/ML IJ SOLN
5.0000 mg | Freq: Four times a day (QID) | INTRAMUSCULAR | Status: DC
  Administered 2019-08-06 – 2019-08-07 (×4): 5 mg via INTRAVENOUS
  Filled 2019-08-06 (×4): qty 2

## 2019-08-06 MED ORDER — PSYLLIUM 95 % PO PACK
1.0000 | PACK | Freq: Every day | ORAL | Status: DC
  Administered 2019-08-06 – 2019-08-07 (×2): 1 via ORAL
  Filled 2019-08-06 (×2): qty 1

## 2019-08-06 NOTE — Progress Notes (Signed)
6 Days Post-Op    CC: Abdominal pain  Subjective: Patient still distended.  He had several bowel movements after the oral contrast yesterday.  He says he can tolerate the full liquids but he has to go slow because he feels bloated so quickly after eating.  He has been up walking and doing everything we have asked him so far.  His blood pressure did improve after being placed on Norvasc.  Objective: Vital signs in last 24 hours: Temp:  [98.2 F (36.8 C)-98.8 F (37.1 C)] 98.5 F (36.9 C) (10/27 0620) Pulse Rate:  [65-82] 82 (10/27 0620) Resp:  [18] 18 (10/27 0620) BP: (134-169)/(88-100) 134/88 (10/27 0620) SpO2:  [100 %] 100 % (10/27 0620) Last BM Date: 08/05/19 1080 p.o.  2141 IV  Voided x10 Stool x8 Afebrile blood pressure still elevated but improving. Creatinine stable at 1.75. CT completed last evening at 6:43 PM: Dilatation of of almost the entire small bowel from the fourth portion of the duodenum to the most distal aspect of the ileum.  No definitive mass lesion is seen in these changes like represent a postop ileus without evidence of perforation.  The stomach was decompressed changes consistent with recent appendectomy no focal fluid collection is noted in the region of the previous appendectomy. intake/Output from previous day: 10/26 0701 - 10/27 0700 In: 3381.3 [P.O.:1080; I.V.:2141.3; IV Piggyback:160] Out: 0  Intake/Output this shift: No intake/output data recorded.  General appearance: alert, cooperative and no distress Resp: clear to auscultation bilaterally GI: He remains extremely distended.  Still has some high-pitched bowel sounds.  He may be just a tad less distended than yesterday.  He did have some bowel movements after the oral contrast.  Lab Results:  Recent Labs    08/04/19 0338  WBC 8.3  HGB 11.6*  HCT 35.6*  PLT 329    BMET Recent Labs    08/05/19 0314 08/06/19 0351  NA 138 134*  K 4.0 3.9  CL 107 107  CO2 21* 18*  GLUCOSE 104* 89   BUN 29* 25*  CREATININE 1.78* 1.75*  CALCIUM 8.2* 8.2*   PT/INR No results for input(s): LABPROT, INR in the last 72 hours.  Recent Labs  Lab 07/30/19 1338 08/04/19 0338  AST 17 16  ALT 16 15  ALKPHOS 57 55  BILITOT 1.2 0.7  PROT 8.6* 6.7  ALBUMIN 3.6 2.7*     Lipase     Component Value Date/Time   LIPASE 21 07/30/2019 1338     Medications: . amLODipine  5 mg Oral Daily  . heparin injection (subcutaneous)  5,000 Units Subcutaneous Q8H  . lip balm  1 application Topical BID  . loratadine  10 mg Oral Daily  . metoprolol tartrate  50 mg Oral BID  . saccharomyces boulardii  250 mg Oral BID    Assessment/Plan AKI- 2.56 >>2.59>>2.40>>2.21>>2.14(10/23)>>1.78(10/26) - single kidney(prior kidney donation) COVID NEGATIVE  Acute supprative appendicitis with generalized peritonitis Laparoscopic appendectomy 07/31/2019 Dr. Stark Klein POD #6  - post op ileus>>NG 10/24>> NG out 10/25  FEN: IV fluids/clear liquids ID: Zosyn 10/20>>day 7/7 DVT: Restart heparin this afternoon Follow-up: [facility]  Plan: Asked the pharmacy to discontinue his Zosyn after the 2200 dose today.  We will add some Reglan 5 mg IV every 6 x48 hours.  Continue full liquids for now.        LOS: 7 days    Kent Kelly 08/06/2019 Please see Amion

## 2019-08-06 NOTE — Progress Notes (Signed)
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  At the time of admission his creatinine was 2.59 on 07/29/2021. Now his creatinine is down to 2.21 with IV hydration. Patient does not know what his baseline creatinine is. The patient has not seen a doctor since 2017, at that time his creatinine was 1.68.  Hospitalist service was consulted by general surgery for assistance with medical management in terms of his renal failure.   Assessment & Plan:   Principal Problem:   Suppurative appendicitis s/p lap appendectomy 07/31/2019 Active Problems:   Elevated serum creatinine  Acute suppurative appendicitis with perforation Patient initially presented with right lower quadrant abdominal pain was found to have acute appendicitis.  Underwent laparoscopic appendectomy on 07/31/2019 by Dr. Donell BeersByerly.  Pathology notable for acute appendicitis with perforation.  Patient reports passing flatus with small bowel movements. --KUB 08/04/2019 notable for findings of small bowel obstruction, no free air. --Continues on IV Zosyn, to end this evening at 2200 --CT abdomen/pelvis on 08/05/2019 with dilation small bowel from fourth portion duodenum to distal ileum without mass/lesion/perforation consistent with postop ileus. 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  Has not seen a physician in over 2 years.  Last creatinine 1.68 in 2017 with a GFR of 54.  High of 2.59 during hospitalization, likely secondary to underlying sepsis from appendicitis with perforation as above.   None with normal appearance/thickness/echogenicity without lesions or hydronephrosis on right kidney and known surgically absent left kidney. --Creatinine down to 1.75 today --Continue to monitor creatinine closely  --Continue IV fluids with NS at 50 mL's per hour until diet further advanced --Avoid nephrotoxins, renally dose all medications --Continue to monitor strict I's and O's, especially urine output --Will need close follow-up with PCP and nephrology following discharge.  Elevated blood pressure Patient denies history of hypertension.  Has not seen a physician for several years. --BP 134/88 this morning --Continue metoprolol tartrate 50 mg p.o. twice daily --continue amlodipine 5 mg p.o. daily --Metoprolol 5 mg IV every 6 hours prn --Vasotec IV q6hrs prn; would try to avoid given his renal dysfunction if possible   DVT prophylaxis: Heparin Code Status: Full code Family Communication: No family present at bedside this morning Disposition Plan: Per primary, general surgery   Procedures:   Laparoscopic appendectomy, 07/31/2019, Dr. Donell BeersByerly  Antimicrobials:   Zosyn 10/20 - 10/27   Subjective: Patient seen and examined at bedside, walking around room.  Continues to tolerate diet.  Reports flatus with small BMs.  Continues with abdominal distention.  Repeat CT abdomen/pelvis yesterday concerning for possible postop ileus.  General surgery advancing diet with scheduled Reglan, Pepto-Bismol, bowel regimen.  Creatinine continues to improve, down to 1.75 today.  Blood pressure better controlled after starting amlodipine yesterday.  No other complaints or concerns at this time.  Denies headache, no fever/chills/night sweats, no chest pain, no palpitations, no shortness of breath, no fatigue, no weakness, no paresthesias.  No acute events overnight per nursing staff.  Objective: Vitals:   08/05/19 1322 08/05/19 1729 08/05/19 2010 08/06/19 0620  BP: (!) 169/100 (!) 156/98 (!) 152/94  134/88  Pulse: 65  73 82  Resp: 18  18 18   Temp: 98.2 F (36.8 C)  98.8 F (37.1 C) 98.5 F (36.9 C)  TempSrc: Oral  Oral Oral  SpO2: 100%  100% 100%  Weight:      Height:        Intake/Output Summary (Last 24 hours) at 08/06/2019 1309 Last data filed at 08/06/2019 0603 Gross per 24 hour  Intake 1445.24 ml  Output 0 ml  Net 1445.24 ml   Filed Weights   07/30/19 1316  Weight: 87.1 kg    Examination:  General exam: Appears calm and comfortable  Respiratory system: Clear to auscultation. Respiratory effort normal. Cardiovascular system: S1 & S2 heard, RRR. No JVD, murmurs, rubs, gallops or clicks. No pedal edema. Gastrointestinal system: Abdomen distended; soft and nontender. No organomegaly or masses felt. Normal bowel sounds heard. Central nervous system: Alert and oriented. No focal neurological deficits. Extremities: Symmetric 5 x 5 power. Skin: No rashes, lesions or ulcers Psychiatry: Judgement and insight appear normal. Mood & affect appropriate.     Data Reviewed: I have personally reviewed following labs and imaging studies  CBC: Recent Labs  Lab 07/30/19 1338 07/31/19 0554 07/31/19 1126 08/01/19 0341 08/02/19 0323 08/04/19 0338  WBC 10.9* 8.4  --  8.1 8.2 8.3  NEUTROABS  --   --   --   --   --  6.1  HGB 15.3 13.5 15.0 11.5* 11.9* 11.6*  HCT 48.5 42.1 44.0 35.7* 37.0* 35.6*  MCV 73.0* 71.0*  --  71.4* 70.2* 69.3*  PLT 261 264  --  231 279 329   Basic Metabolic Panel: Recent Labs  Lab 07/31/19 0826  08/02/19 0323 08/03/19 0427 08/04/19 0338 08/05/19 0314 08/06/19 0351  NA 134*   < > 136 137 139 138 134*  K 3.9   < > 4.1 4.2 3.8 4.0 3.9  CL 99   < > 105 106 106 107 107  CO2 22   < > 21* 20* 22 21* 18*  GLUCOSE 121*   < > 114* 110* 107* 104* 89  BUN 44*   < > 29* 32* 35* 29* 25*  CREATININE 2.59*   < > 2.14* 2.01* 2.02* 1.78* 1.75*  CALCIUM 8.3*   < > 8.0* 8.3* 8.3* 8.2* 8.2*  MG 2.8*  --   --  2.7*  --   --  2.1   < > = values in this  interval not displayed.   GFR: Estimated Creatinine Clearance: 49.4 mL/min (A) (by C-G formula based on SCr of 1.75 mg/dL (H)). Liver Function Tests: Recent Labs  Lab 07/30/19 1338 08/04/19 0338  AST 17 16  ALT 16 15  ALKPHOS 57 55  BILITOT 1.2 0.7  PROT 8.6* 6.7  ALBUMIN 3.6 2.7*   Recent Labs  Lab 07/30/19 1338  LIPASE 21   No results for input(s): AMMONIA in the last 168 hours. Coagulation Profile: No results for input(s): INR, PROTIME in the last 168 hours. Cardiac Enzymes: No results for input(s): CKTOTAL, CKMB, CKMBINDEX, TROPONINI in the last 168 hours. BNP (last 3 results) No results for input(s): PROBNP in the last 8760 hours. HbA1C: No results for input(s): HGBA1C in the last 72 hours. CBG: No results for input(s): GLUCAP in the last 168 hours. Lipid Profile: No results for input(s): CHOL, HDL, LDLCALC, TRIG, CHOLHDL, LDLDIRECT in the last 72 hours. Thyroid Function Tests: No results for input(s): TSH, T4TOTAL, FREET4, T3FREE, THYROIDAB in the last 72  hours. Anemia Panel: No results for input(s): VITAMINB12, FOLATE, FERRITIN, TIBC, IRON, RETICCTPCT in the last 72 hours. Sepsis Labs: No results for input(s): PROCALCITON, LATICACIDVEN in the last 168 hours.  Recent Results (from the past 240 hour(s))  SARS CORONAVIRUS 2 (TAT 6-24 HRS) Nasopharyngeal Nasopharyngeal Swab     Status: None   Collection Time: 07/30/19  5:49 PM   Specimen: Nasopharyngeal Swab  Result Value Ref Range Status   SARS Coronavirus 2 NEGATIVE NEGATIVE Final    Comment: (NOTE) SARS-CoV-2 target nucleic acids are NOT DETECTED. The SARS-CoV-2 RNA is generally detectable in upper and lower respiratory specimens during the acute phase of infection. Negative results do not preclude SARS-CoV-2 infection, do not rule out co-infections with other pathogens, and should not be used as the sole basis for treatment or other patient management decisions. Negative results must be combined with  clinical observations, patient history, and epidemiological information. The expected result is Negative. Fact Sheet for Patients: SugarRoll.be Fact Sheet for Healthcare Providers: https://www.woods-mathews.com/ This test is not yet approved or cleared by the [place] FDA and  has been authorized for detection and/or diagnosis of SARS-CoV-2 by FDA under an Emergency Use Authorization (EUA). This EUA will remain  in effect (meaning this test can be used) for the duration of the COVID-19 declaration under Section 56 4(b)(1) of the Act, 21 U.S.C. section 360bbb-3(b)(1), unless the authorization is terminated or revoked sooner. Performed at [facility], [place] [address] [place], [place] [address]          Radiology Studies: Ct Abdomen Pelvis W Contrast  Result Date: 08/05/2019 CLINICAL DATA:  Diffuse abdominal pain, history of recent appendicitis, possible ileus EXAM: CT ABDOMEN AND PELVIS WITH CONTRAST TECHNIQUE: Multidetector CT imaging of the abdomen and pelvis was performed using the standard protocol following bolus administration of intravenous contrast. CONTRAST:  112mL OMNIPAQUE IOHEXOL 300 MG/ML SOLN, 17mL OMNIPAQUE IOHEXOL 300 MG/ML SOLN COMPARISON:  07/30/2019 FINDINGS: Lower chest: Mild left lower lobe atelectatic changes are noted. Scarring in the right middle lobe is noted stable from the prior exam. Hepatobiliary: Tiny hypodensity is noted within the right lobe of the liver near the dome of the diaphragm likely representing a small cyst but measuring less than 1 cm. Gallbladder is decompressed. Minimal amount of perihepatic fluid is noted. Pancreas: Unremarkable. No pancreatic ductal dilatation or surrounding inflammatory changes. Spleen: Spleen is within normal limits. A small amount of free fluid surrounding the superior aspect of the spleen is noted. Adrenals/Urinary Tract: Adrenal glands are within normal limits. Left  kidney has been surgically removed, history of donation. The right kidney is well visualized and within normal limits. The bladder is decompressed. Stomach/Bowel: Colon is decompressed. Changes consistent with the recent appendectomy are noted. No focal fluid collection is noted in the region of the previous appendectomy. Stomach is within normal limits. Dilatation of the small bowel is noted starting in the distal aspect of the duodenum and continuing throughout almost the entire small bowel. Only to the distal most aspect of the ileum is decompressed. These changes likely represent a postoperative ileus. Correlation with the physical exam is recommended. Vascular/Lymphatic: Aortic atherosclerosis. No enlarged abdominal or pelvic lymph nodes. Reproductive: Prostate is unremarkable. Other: Small amount of free pelvic fluid is noted which may be related to the recent surgery although may be reactive as well. No evidence of perforation is seen. Musculoskeletal: No acute or significant osseous findings. IMPRESSION: Dilatation of almost the entire small bowel from the fourth portion  of the duodenum to the most distal aspect of the ileum. No definitive mass lesion is seen and these changes likely represent a postoperative ileus without evidence of perforation. No nasogastric catheter is noted at this time. Small amount of free fluid likely reactive in nature. Status post left nephrectomy. Electronically Signed   By: Alcide Clever M.D.   On: 08/05/2019 19:57        Scheduled Meds: . amLODipine  5 mg Oral Daily  . heparin injection (subcutaneous)  5,000 Units Subcutaneous Q8H  . lip balm  1 application Topical BID  . loratadine  10 mg Oral Daily  . metoCLOPramide (REGLAN) injection  5 mg Intravenous Q6H  . metoprolol tartrate  50 mg Oral BID  . psyllium  1 packet Oral Daily  . saccharomyces boulardii  250 mg Oral BID  . simethicone  40 mg Oral QID  . sodium chloride flush  3 mL Intravenous Q12H    Continuous Infusions: . sodium chloride    . lactated ringers    . piperacillin-tazobactam (ZOSYN)  IV 3.375 g (08/06/19 0603)     LOS: 7 days    Time spent: 32 minutes spent on chart review, discussion with nursing staff, consultants, updating family and interview/physical exam; more than 50% of that time was spent in counseling and/or coordination of care.    Alvira Philips [place], DO [facility] 08/06/2019, 1:09 PM

## 2019-08-07 DIAGNOSIS — K3580 Unspecified acute appendicitis: Secondary | ICD-10-CM | POA: Diagnosis not present

## 2019-08-07 DIAGNOSIS — N182 Chronic kidney disease, stage 2 (mild): Secondary | ICD-10-CM | POA: Insufficient documentation

## 2019-08-07 LAB — BASIC METABOLIC PANEL
Anion gap: 9 (ref 5–15)
BUN: 24 mg/dL — ABNORMAL HIGH (ref 6–20)
CO2: 18 mmol/L — ABNORMAL LOW (ref 22–32)
Calcium: 8.1 mg/dL — ABNORMAL LOW (ref 8.9–10.3)
Chloride: 106 mmol/L (ref 98–111)
Creatinine, Ser: 1.96 mg/dL — ABNORMAL HIGH (ref 0.61–1.24)
GFR calc Af Amer: 42 mL/min — ABNORMAL LOW (ref >60)
GFR calc non Af Amer: 37 mL/min — ABNORMAL LOW (ref >60)
Glucose, Bld: 100 mg/dL — ABNORMAL HIGH (ref 70–99)
Potassium: 3.9 mmol/L (ref 3.5–5.1)
Sodium: 133 mmol/L — ABNORMAL LOW (ref 135–145)

## 2019-08-07 MED ORDER — METOCLOPRAMIDE HCL 10 MG/10ML PO SOLN
10.0000 mg | Freq: Three times a day (TID) | ORAL | Status: DC
  Administered 2019-08-07 (×2): 10 mg via ORAL
  Filled 2019-08-07 (×5): qty 10

## 2019-08-07 MED ORDER — SODIUM CHLORIDE 0.9 % IV SOLN: 250.0000 mL | INTRAVENOUS | Status: DC | PRN

## 2019-08-07 NOTE — Plan of Care (Signed)
Patient sitting up in bed this morning; no pain at this time. No other needs expressed at this time. Will continue to monitor.

## 2019-08-07 NOTE — Progress Notes (Signed)
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  At the time of admission his creatinine was 2.59 on 07/29/2021. Now his creatinine is down to 2.21 with IV hydration. Patient does not know what his baseline creatinine is. The patient has not seen a doctor since 2017, at that time his creatinine was 1.68.  Hospitalist service was consulted by general surgery for assistance with medical management in terms of his renal failure.   Assessment & Plan:   Principal Problem:   Suppurative appendicitis s/p lap appendectomy 07/31/2019 Active Problems:   Elevated serum creatinine   Donor of kidney for transplant   CKD (chronic kidney disease) stage 2, GFR 60-89 ml/min - Baseline Cr 1.6 after L kidney donation  Acute suppurative appendicitis with perforation Patient initially presented with right lower quadrant abdominal pain was found to have acute appendicitis.  Underwent laparoscopic appendectomy on 07/31/2019 by Dr. Barry Dienes.  Pathology notable for acute appendicitis with perforation.  Patient reports passing flatus with small bowel movements. --KUB 08/04/2019 notable for findings of small bowel obstruction, no free air. --Completed course of IV Zosyn on 08/06/2019 --CT abdomen/pelvis on 08/05/2019 with dilation small bowel from fourth portion duodenum to distal ileum without mass/lesion/perforation consistent with postop ileus. 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  Has not seen a physician in over 2 years.  Last creatinine 1.68 in 2017 with a GFR of 54.  High of 2.59 during hospitalization,  likely secondary to underlying sepsis from appendicitis with perforation as above.  None with normal appearance/thickness/echogenicity without lesions or hydronephrosis on right kidney and known surgically absent left kidney. --Creatinine 1.75-->1.96 today --Continue to monitor creatinine closely  --Increase IV fluids with NS to 75 mL's per hour until diet further advanced --Avoid nephrotoxins, renally dose all medications --Continue to monitor strict I's and O's, especially urine output --Will need close follow-up with PCP and nephrology following discharge.  Elevated blood pressure Patient denies history of hypertension.  Has not seen a physician for several years. --BP 142/79 this morning --Continue metoprolol tartrate 50 mg p.o. twice daily --continue amlodipine 10 mg p.o. daily --Metoprolol 5 mg IV every 6 hours prn --Discontinued Vasotec IV as would avoid ACEi's given his renal dysfunction   DVT prophylaxis: Heparin Code Status: Full code Family Communication: No family present at bedside this morning Disposition Plan: Per primary, general surgery   Procedures:   Laparoscopic appendectomy, 07/31/2019, Dr. Barry Dienes  Antimicrobials:   Zosyn 10/20 - 10/27   Subjective: Patient seen and examined at bedside, walking around room.  Reported some abdominal pain yesterday with worsening distention, now improved today.  No nausea/vomiting with current diet.  Continues to report flatus with BMs.  Surgery hopes advance diet later this afternoon.  Discussed with surgery at bedside.  No other complaints or concerns at this time.  Denies headache, no fever/chills/night sweats, no chest pain, no palpitations, no shortness of breath, no fatigue, no weakness, no paresthesias.  No acute events overnight per nursing staff.  Objective: Vitals:   08/06/19 1426 08/06/19 2029 08/07/19 0620 08/07/19 1309  BP: (!) 164/99 123/70 (!) 142/79 (!) 137/98  Pulse:  63 94 78 79  Resp: Temp:  98.1 F (36.7 C) 98.8 F (37.1 C) 98.4 F (36.9 C) 98 F (36.7 C)  TempSrc: Oral Oral Oral Oral  SpO2: 100% 100% 100% 100%  Weight:      Height:        Intake/Output Summary (Last 24 hours) at 08/07/2019 1359 Last data filed at 08/07/2019 0800 Gross per 24 hour  Intake 1289.49 ml  Output 400 ml  Net 889.49 ml   Filed Weights   07/30/19 1316  Weight: 87.1 kg    Examination:  General exam: Appears calm and comfortable  Respiratory system: Clear to auscultation. Respiratory effort normal. Cardiovascular system: S1 & S2 heard, RRR. No JVD, murmurs, rubs, gallops or clicks. No pedal edema. Gastrointestinal system: Abdomen distended; soft and nontender. No organomegaly or masses felt. Normal bowel sounds heard. Central nervous system: Alert and oriented. No focal neurological deficits. Extremities: Symmetric 5 x 5 power. Skin: No rashes, lesions or ulcers Psychiatry: Judgement and insight appear normal. Mood & affect appropriate.     Data Reviewed: I have personally reviewed following labs and imaging studies  CBC: Recent Labs  Lab 08/01/19 0341 08/02/19 0323 08/04/19 0338  WBC 8.1 8.2 8.3  NEUTROABS  --   --  6.1  HGB 11.5* 11.9* 11.6*  HCT 35.7* 37.0* 35.6*  MCV 71.4* 70.2* 69.3*  PLT 231 279 329   Basic Metabolic Panel: Recent Labs  Lab 08/03/19 0427 08/04/19 0338 08/05/19 0314 08/06/19 0351 08/07/19 0340  NA 137 139 138 134* 133*  K 4.2 3.8 4.0 3.9 3.9  CL 106 106 107 107 106  CO2 20* 22 21* 18* 18*  GLUCOSE 110* 107* 104* 89 100*  BUN 32* 35* 29* 25* 24*  CREATININE 2.01* 2.02* 1.78* 1.75* 1.96*  CALCIUM 8.3* 8.3* 8.2* 8.2* 8.1*  MG 2.7*  --   --  2.1  --    GFR: Estimated Creatinine Clearance: 44.1 mL/min (A) (by C-G formula based on SCr of 1.96 mg/dL (H)). Liver Function Tests: Recent Labs  Lab 08/04/19 0338  AST 16  ALT 15  ALKPHOS 55  BILITOT 0.7  PROT 6.7  ALBUMIN 2.7*   No results for input(s): LIPASE, AMYLASE in the last 168  hours. No results for input(s): AMMONIA in the last 168 hours. Coagulation Profile: No results for input(s): INR, PROTIME in the last 168 hours. Cardiac Enzymes: No results for input(s): CKTOTAL, CKMB, CKMBINDEX, TROPONINI in the last 168 hours. BNP (last 3 results) No results for input(s): PROBNP in the last 8760 hours. HbA1C: No results for input(s): HGBA1C in the last 72 hours. CBG: No results for input(s): GLUCAP in the last 168 hours. Lipid Profile: No results for input(s): CHOL, HDL, LDLCALC, TRIG, CHOLHDL, LDLDIRECT in the last 72 hours. Thyroid Function Tests: No results for input(s): TSH, T4TOTAL, FREET4, T3FREE, THYROIDAB in the last 72 hours. Anemia Panel: No results for input(s): VITAMINB12, FOLATE, FERRITIN, TIBC, IRON, RETICCTPCT in the last 72 hours. Sepsis Labs: No results for input(s): PROCALCITON, LATICACIDVEN in the last 168 hours.  Recent Results (from the past 240 hour(s))  SARS CORONAVIRUS 2 (TAT 6-24 HRS) Nasopharyngeal Nasopharyngeal Swab     Status: None   Collection Time: 07/30/19  5:49 PM   Specimen: Nasopharyngeal Swab  Result Value Ref Range Status   SARS Coronavirus 2 NEGATIVE NEGATIVE Final    Comment: (NOTE) SARS-CoV-2 target nucleic acids are NOT DETECTED. The SARS-CoV-2 RNA is generally detectable in  upper and lower respiratory specimens during the acute phase of infection. Negative results do not preclude SARS-CoV-2 infection, do not rule out co-infections with other pathogens, and should not be used as the sole basis for treatment or other patient management decisions. Negative results must be combined with clinical observations, patient history, and epidemiological information. The expected result is Negative. Fact Sheet for Patients: HairSlick.nohttps://www.fda.gov/media/138098/download Fact Sheet for Healthcare Providers: quierodirigir.comhttps://www.fda.gov/media/138095/download This test is not yet approved or cleared by the [place] FDA and  has been  authorized for detection and/or diagnosis of SARS-CoV-2 by FDA under an Emergency Use Authorization (EUA). This EUA will remain  in effect (meaning this test can be used) for the duration of the COVID-19 declaration under Section 56 4(b)(1) of the Act, 21 U.S.C. section 360bbb-3(b)(1), unless the authorization is terminated or revoked sooner. Performed at [facility], [address] [place], [place] [address]          Radiology Studies: Ct Abdomen Pelvis W Contrast  Result Date: 08/05/2019 CLINICAL DATA:  Diffuse abdominal pain, history of recent appendicitis, possible ileus EXAM: CT ABDOMEN AND PELVIS WITH CONTRAST TECHNIQUE: Multidetector CT imaging of the abdomen and pelvis was performed using the standard protocol following bolus administration of intravenous contrast. CONTRAST:  100mL OMNIPAQUE IOHEXOL 300 MG/ML SOLN, 30mL OMNIPAQUE IOHEXOL 300 MG/ML SOLN COMPARISON:  07/30/2019 FINDINGS: Lower chest: Mild left lower lobe atelectatic changes are noted. Scarring in the right middle lobe is noted stable from the prior exam. Hepatobiliary: Tiny hypodensity is noted within the right lobe of the liver near the dome of the diaphragm likely representing a small cyst but measuring less than 1 cm. Gallbladder is decompressed. Minimal amount of perihepatic fluid is noted. Pancreas: Unremarkable. No pancreatic ductal dilatation or surrounding inflammatory changes. Spleen: Spleen is within normal limits. A small amount of free fluid surrounding the superior aspect of the spleen is noted. Adrenals/Urinary Tract: Adrenal glands are within normal limits. 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 The right kidney is well visualized and within normal limits. The bladder is decompressed. Stomach/Bowel: Colon is decompressed. Changes consistent with the recent appendectomy are noted. No focal fluid collection is noted in the region of the previous appendectomy. Stomach is within  normal limits. Dilatation of the small bowel is noted starting in the distal aspect of the duodenum and continuing throughout almost the entire small bowel. Only to the distal most aspect of the ileum is decompressed. These changes likely represent a postoperative ileus. Correlation with the physical exam is recommended. Vascular/Lymphatic: Aortic atherosclerosis. No enlarged abdominal or pelvic lymph nodes. Reproductive: Prostate is unremarkable. Other: Small amount of free pelvic fluid is noted which may be related to the recent surgery although may be reactive as well. No evidence of perforation is seen. Musculoskeletal: No acute or significant osseous findings. IMPRESSION: Dilatation of almost the entire small bowel from the fourth portion of the duodenum to the most distal aspect of the ileum. No definitive mass lesion is seen and these changes likely represent a postoperative ileus without evidence of perforation. No nasogastric catheter is noted at this time. Small amount of free fluid likely reactive in nature. Status post left nephrectomy. Electronically Signed   By: Alcide CleverMark  Lukens M.D.   On: 08/05/2019 19:57        Scheduled Meds:  amLODipine  10 mg Oral Daily   heparin injection (subcutaneous)  5,000 Units Subcutaneous Q8H   lip balm  1 application Topical BID  loratadine  10 mg Oral Daily   metoCLOPramide  10 mg Oral TID AC   metoprolol tartrate  50 mg Oral BID   psyllium  1 packet Oral Daily   saccharomyces boulardii  250 mg Oral BID   sodium chloride flush  3 mL Intravenous Q12H   Continuous Infusions:  sodium chloride     lactated ringers       LOS: 8 days    Time spent: 32 minutes spent on chart review, discussion with nursing staff, consultants, updating family and interview/physical exam; more than 50% of that time was spent in counseling and/or coordination of care.    Alvira Philips [place], DO [facility] 08/07/2019, 1:59 PM

## 2019-08-07 NOTE — Progress Notes (Signed)
7 Days Post-Op    CC:  Subjective: He says he feels better this a.m.  He has had several BMs.  He remains distended but not quite as much as before.  I talked with Dr.[place], the best we know his baseline creatinine is around 1.6.   Objective: Vital signs in last 24 hours: Temp:  [98.1 F (36.7 C)-98.8 F (37.1 C)] 98.4 F (36.9 C) (10/28 0620) Pulse Rate:  [63-94] 78 (10/28 0620) Resp:  [18-20] 18 (10/28 0620) BP: (123-164)/(70-99) 142/79 (10/28 0620) SpO2:  [100 %] 100 % (10/28 0620) Last BM Date: 08/06/19 1040 p.o. 129 IV He has 400 urine output recorded for yesterday. BM x1 Afebrile blood pressure still slightly elevated intermittently. Creatinine is going back up.  1.96 Intake/Output from previous day: 10/27 0701 - 10/28 0700 In: 1169.5 [P.O.:1040; IV Piggyback:129.5] Out: 400 [Urine:400] Intake/Output this shift: No intake/output data recorded.  General appearance: alert, cooperative and no distress Resp: clear to auscultation bilaterally GI: He is still fairly distended.  Bowel sounds are positive.  He is having some BMs.  Lab Results:  No results for input(s): WBC, HGB, HCT, PLT in the last 72 hours.  BMET Recent Labs    08/06/19 0351 08/07/19 0340  NA 134* 133*  K 3.9 3.9  CL 107 106  CO2 18* 18*  GLUCOSE 89 100*  BUN 25* 24*  CREATININE 1.75* 1.96*  CALCIUM 8.2* 8.1*   PT/INR No results for input(s): LABPROT, INR in the last 72 hours.  Recent Labs  Lab 08/04/19 0338  AST 16  ALT 15  ALKPHOS 55  BILITOT 0.7  PROT 6.7  ALBUMIN 2.7*     Lipase     Component Value Date/Time   LIPASE 21 07/30/2019 1338     Medications: . amLODipine  10 mg Oral Daily  . heparin injection (subcutaneous)  5,000 Units Subcutaneous Q8H  . lip balm  1 application Topical BID  . loratadine  10 mg Oral Daily  . metoCLOPramide (REGLAN) injection  5 mg Intravenous Q6H  . metoprolol tartrate  50 mg Oral BID  . psyllium  1 packet Oral Daily  . saccharomyces  boulardii  250 mg Oral BID  . simethicone  40 mg Oral QID  . sodium chloride flush  3 mL Intravenous Q12H   . sodium chloride    . lactated ringers      Assessment/Plan AKI- 2.56 >>2.59>>2.40>>2.21>>2.14(10/23)>>1.78(10/26)>>1.96(10/28) - single kidney(prior kidney donation) COVID NEGATIVE  Acute supprative appendicitis with generalized peritonitis Laparoscopic appendectomy 07/31/2019 Dr. Stark Klein POD #6  - post op ileus>>NG 10/24>> NG out 10/25  FEN: IV fluids/full liquids ID: Zosyn 10/20-10/27 DVT: Restart heparin this afternoon Follow-up: [facility]  Plan: We will leave you on full liquids this morning.  I will check on him later and see how he is doing he is really anxious to get to a soft diet.  We will restart some IV fluids on him, and continue to watch his renal function closely.  Increase his Reglan to 10 mg AC TID.       LOS: 8 days    Ellianna Ruest 08/07/2019 Please see Amion

## 2019-08-08 DIAGNOSIS — K3580 Unspecified acute appendicitis: Secondary | ICD-10-CM | POA: Diagnosis not present

## 2019-08-08 LAB — BASIC METABOLIC PANEL
Anion gap: 9 (ref 5–15)
BUN: 19 mg/dL (ref 6–20)
CO2: 20 mmol/L — ABNORMAL LOW (ref 22–32)
Calcium: 8.2 mg/dL — ABNORMAL LOW (ref 8.9–10.3)
Chloride: 106 mmol/L (ref 98–111)
Creatinine, Ser: 1.74 mg/dL — ABNORMAL HIGH (ref 0.61–1.24)
GFR calc Af Amer: 49 mL/min — ABNORMAL LOW (ref >60)
GFR calc non Af Amer: 42 mL/min — ABNORMAL LOW (ref >60)
Glucose, Bld: 104 mg/dL — ABNORMAL HIGH (ref 70–99)
Potassium: 3.8 mmol/L (ref 3.5–5.1)
Sodium: 135 mmol/L (ref 135–145)

## 2019-08-08 LAB — CBC
HCT: 34.3 % — ABNORMAL LOW (ref 39.0–52.0)
Hemoglobin: 11.1 g/dL — ABNORMAL LOW (ref 13.0–17.0)
MCH: 22.7 pg — ABNORMAL LOW (ref 26.0–34.0)
MCHC: 32.4 g/dL (ref 30.0–36.0)
MCV: 70.3 fL — ABNORMAL LOW (ref 80.0–100.0)
Platelets: 385 10*3/uL (ref 150–400)
RBC: 4.88 MIL/uL (ref 4.22–5.81)
RDW: 15 % (ref 11.5–15.5)
WBC: 7.5 10*3/uL (ref 4.0–10.5)
nRBC: 0 % (ref 0.0–0.2)

## 2019-08-08 MED ORDER — AMLODIPINE BESYLATE 10 MG PO TABS: 10.0000 mg | ORAL_TABLET | Freq: Every day | ORAL | 0 refills | Status: AC

## 2019-08-08 MED ORDER — METOPROLOL TARTRATE 50 MG PO TABS: 50.0000 mg | ORAL_TABLET | Freq: Two times a day (BID) | ORAL | 0 refills | Status: AC

## 2019-08-08 MED ORDER — METOCLOPRAMIDE HCL 10 MG PO TABS: 10.0000 mg | ORAL_TABLET | Freq: Three times a day (TID) | ORAL | 1 refills | Status: AC

## 2019-08-08 MED ORDER — ACETAMINOPHEN 325 MG PO TABS: 650.0000 mg | ORAL_TABLET | Freq: Four times a day (QID) | ORAL | Status: AC | PRN

## 2019-08-08 MED ORDER — PSYLLIUM 95 % PO PACK: PACK | ORAL | Status: AC

## 2019-08-08 MED ORDER — SIMETHICONE 80 MG PO CHEW: 80.0000 mg | CHEWABLE_TABLET | Freq: Four times a day (QID) | ORAL | 2 refills | Status: AC | PRN

## 2019-08-08 NOTE — Progress Notes (Signed)
8 Days Post-Op    CC: Abdominal pain  Subjective: Patient is tolerating soft diet, he had 2 bowel movements yesterday.  He still extremely distended.  But he is anxious to go home.  Objective: Vital signs in last 24 hours: Temp:  [98 F (36.7 C)-98.5 F (36.9 C)] 98.5 F (36.9 C) (10/29 0612) Pulse Rate:  [76-82] 82 (10/29 0612) Resp:  [16-18] 18 (10/29 0612) BP: (122-137)/(84-98) 127/94 (10/29 0612) SpO2:  [100 %] 100 % (10/28 2012) Last BM Date: 08/07/19 840 p.o. Nothing recorded for his IV Voided x8 BM x2 Afebrile vital signs are stable. Creatinine down to 1.74 again today. WBC stable at 7.5. Intake/Output from previous day: 10/28 0701 - 10/29 0700 In: 840 [P.O.:840] Out: -  Intake/Output this shift: No intake/output data recorded.  General appearance: alert, cooperative and no distress Resp: clear to auscultation bilaterally GI: Distended, positive bowel sounds positive BM.  Lab Results:  Recent Labs    08/08/19 0339  WBC 7.5  HGB 11.1*  HCT 34.3*  PLT 385    BMET Recent Labs    08/07/19 0340 08/08/19 0339  NA 133* 135  K 3.9 3.8  CL 106 106  CO2 18* 20*  GLUCOSE 100* 104*  BUN 24* 19  CREATININE 1.96* 1.74*  CALCIUM 8.1* 8.2*   PT/INR No results for input(s): LABPROT, INR in the last 72 hours.  Recent Labs  Lab 08/04/19 0338  AST 16  ALT 15  ALKPHOS 55  BILITOT 0.7  PROT 6.7  ALBUMIN 2.7*     Lipase     Component Value Date/Time   LIPASE 21 07/30/2019 1338     Medications: . amLODipine  10 mg Oral Daily  . heparin injection (subcutaneous)  5,000 Units Subcutaneous Q8H  . lip balm  1 application Topical BID  . loratadine  10 mg Oral Daily  . metoCLOPramide  10 mg Oral TID AC  . metoprolol tartrate  50 mg Oral BID  . psyllium  1 packet Oral Daily  . saccharomyces boulardii  250 mg Oral BID  . sodium chloride flush  3 mL Intravenous Q12H    Assessment/Plan AKI- 2.56  >>2.59>>2.40>>2.21>>2.14(10/23)>>1.78(10/26)>>1.96(10/28) - single kidney(prior kidney donation) COVID NEGATIVE Hypertension >> Norvasc 10 qd/ lopressor 50 mg BID  Acute supprative appendicitis with generalized peritonitis Laparoscopic appendectomy 07/31/2019 Dr. Cleaster Corin #6  - post op ileus>>NG 10/24>> NG out 10/25>> Reglan 10mg  TID/Metamucil   FEN: IV fluids/soft diet ID: Zosyn 10/20-10/27 DVT: Restart heparin this afternoon Follow-up: [facility]  We discussed discharge with Dr. Johney Maine, on Reglan 10mg  TID. Blood pressure still up on Norvasc and Lopressor 50 BID.  Will check with Dr.Aust[place] also and discuss discharge.        LOS: 9 days    Soriya Worster 08/08/2019 Please see Amion

## 2019-08-08 NOTE — Progress Notes (Signed)
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  At the time of admission his creatinine was 2.59 on 07/29/2021. Now his creatinine is down to 2.21 with IV hydration. Patient does not know what his baseline creatinine is. The patient has not seen a doctor since 2017, at that time his creatinine was 1.68.  Hospitalist service was consulted by general surgery for assistance with medical management in terms of his renal failure.   Assessment & Plan:   Principal Problem:   Suppurative appendicitis s/p lap appendectomy 07/31/2019 Active Problems:   Elevated serum creatinine   Donor of kidney for transplant   CKD (chronic kidney disease) stage 2, GFR 60-89 ml/min - Baseline Cr 1.6 after L kidney donation  Acute suppurative appendicitis with perforation Patient initially presented with right lower quadrant abdominal pain was found to have acute appendicitis.  Underwent laparoscopic appendectomy on 07/31/2019 by Dr. Barry Dienes.  Pathology notable for acute appendicitis with perforation.  Patient reports passing flatus with small bowel movements.  Completed course of IV Zosyn on 08/06/2019.  Tolerating diet.  General surgery discharging home today.  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  Has not seen a physician in over 2 years.  Last creatinine 1.68 in 2017 with a GFR of 54.  High of 2.59 during hospitalization, likely secondary to underlying sepsis from appendicitis with perforation as above.  None with normal appearance/thickness/echogenicity without lesions or hydronephrosis on right kidney and known surgically absent left kidney.  Creatinine improved with IV hydration to 1.74  at time of discharge.  Recommend patient follows up closely with his PCP with outpatient nephrology referral for further surveillance of his renal function.  Elevated blood pressure Patient denies history of hypertension.  Has not seen a physician for several years.  Patient was started on metoprolol tartrate 50 mg p.o. twice daily and amlodipine 10 mg p.o. daily with good control of his hypertension.  Blood pressure at time of discharge was 127/94.  Will need for close follow-up with his PCP for further surveillance.  Would avoid ACE inhibitor's given his renal insufficiency.  Final Recommendations: --Discharge home on amlodipine 10 mg p.o. daily and metoprolol tartrate 50 mg p.o. twice daily --Encourage outpatient follow-up with PCP closely with referral for outpatient nephrology.  DVT prophylaxis: Heparin Code Status: Full code Family Communication: No family present at bedside this morning Disposition Plan: Home today per general surgery   Procedures:   Laparoscopic appendectomy, 07/31/2019, Dr. Barry Dienes  Antimicrobials:   Zosyn 10/20 - 10/27   Subjective: Patient seen and examined at bedside, wife present on face time this morning.  Continues with mild abdominal distention without nausea/vomiting and tolerating diet.  Blood pressure better controlled, on amlodipine and metoprolol.  Creatinine now down to 1.74.  Surgery planning discharging home today.   No other complaints or concerns at this time.  Denies headache, no fever/chills/night sweats, no chest pain, no palpitations, no shortness of breath, no fatigue, no weakness, no paresthesias.  No acute events overnight per nursing staff.  Objective: Vitals:   08/07/19 0620 08/07/19 1309 08/07/19 2012 08/08/19 0612  BP: (!) 142/79 (!) 137/98 122/84 (!) 127/94  Pulse: 78 79 76 82  Resp:  18 16 18 18   Temp: 98.4 F (36.9 C) 98 F (36.7 C) 98.4 F (36.9 C) 98.5 F (36.9 C)  TempSrc: Oral Oral Oral Oral  SpO2: 100% 100% 100%     Weight:      Height:        Intake/Output Summary (Last 24 hours) at 08/08/2019 1201 Last data filed at 08/08/2019 0636 Gross per 24 hour  Intake 720 ml  Output --  Net 720 ml   Filed Weights   07/30/19 1316  Weight: 87.1 kg    Examination:  General exam: Appears calm and comfortable  Respiratory system: Clear to auscultation. Respiratory effort normal. Cardiovascular system: S1 & S2 heard, RRR. No JVD, murmurs, rubs, gallops or clicks. No pedal edema. Gastrointestinal system: Abdomen distended; soft and nontender. No organomegaly or masses felt. Normal bowel sounds heard. Central nervous system: Alert and oriented. No focal neurological deficits. Extremities: Symmetric 5 x 5 power. Skin: No rashes, lesions or ulcers Psychiatry: Judgement and insight appear normal. Mood & affect appropriate.     Data Reviewed: I have personally reviewed following labs and imaging studies  CBC: Recent Labs  Lab 08/02/19 0323 08/04/19 0338 08/08/19 0339  WBC 8.2 8.3 7.5  NEUTROABS  --  6.1  --   HGB 11.9* 11.6* 11.1*  HCT 37.0* 35.6* 34.3*  MCV 70.2* 69.3* 70.3*  PLT 279 329 385   Basic Metabolic Panel: Recent Labs  Lab 08/03/19 0427 08/04/19 0338 08/05/19 0314 08/06/19 0351 08/07/19 0340 08/08/19 0339  NA 137 139 138 134* 133* 135  K 4.2 3.8 4.0 3.9 3.9 3.8  CL 106 106 107 107 106 106  CO2 20* 22 21* 18* 18* 20*  GLUCOSE 110* 107* 104* 89 100* 104*  BUN 32* 35* 29* 25* 24* 19  CREATININE 2.01* 2.02* 1.78* 1.75* 1.96* 1.74*  CALCIUM 8.3* 8.3* 8.2* 8.2* 8.1* 8.2*  MG 2.7*  --   --  2.1  --   --    GFR: Estimated Creatinine Clearance: 49.7 mL/min (A) (by C-G formula based on SCr of 1.74 mg/dL (H)). Liver Function Tests: Recent Labs  Lab 08/04/19 0338  AST 16  ALT 15  ALKPHOS 55  BILITOT 0.7  PROT 6.7  ALBUMIN 2.7*   No results for input(s): LIPASE, AMYLASE in the last 168 hours. No results for input(s): AMMONIA in the last 168 hours. Coagulation Profile: No  results for input(s): INR, PROTIME in the last 168 hours. Cardiac Enzymes: No results for input(s): CKTOTAL, CKMB, CKMBINDEX, TROPONINI in the last 168 hours. BNP (last 3 results) No results for input(s): PROBNP in the last 8760 hours. HbA1C: No results for input(s): HGBA1C in the last 72 hours. CBG: No results for input(s): GLUCAP in the last 168 hours. Lipid Profile: No results for input(s): CHOL, HDL, LDLCALC, TRIG, CHOLHDL, LDLDIRECT in the last 72 hours. Thyroid Function Tests: No results for input(s): TSH, T4TOTAL, FREET4, T3FREE, THYROIDAB in the last 72 hours. Anemia Panel: No results for input(s): VITAMINB12, FOLATE, FERRITIN, TIBC, IRON, RETICCTPCT in the last 72 hours. Sepsis Labs: No results for input(s): PROCALCITON, LATICACIDVEN in the last 168 hours.  Recent Results (from the past 240 hour(s))  SARS CORONAVIRUS 2 (TAT 6-24 HRS) Nasopharyngeal Nasopharyngeal Swab     Status: None   Collection Time: 07/30/19  5:49 PM   Specimen: Nasopharyngeal Swab  Result Value Ref Range Status   SARS Coronavirus 2 NEGATIVE NEGATIVE Final    Comment: (NOTE) SARS-CoV-2 target nucleic acids are NOT DETECTED.  The SARS-CoV-2 RNA is generally detectable in upper and lower respiratory specimens during the acute phase of infection. Negative results do not preclude SARS-CoV-2 infection, do not rule out co-infections with other pathogens, and should not be used as the sole basis for treatment or other patient management decisions. Negative results must be combined with clinical observations, patient history, and epidemiological information. The expected result is Negative. Fact Sheet for Patients: HairSlick.nohttps://www.fda.gov/media/138098/download Fact Sheet for Healthcare Providers: quierodirigir.comhttps://www.fda.gov/media/138095/download This test is not yet approved or cleared by the [place] FDA and  has been authorized for detection and/or diagnosis of SARS-CoV-2 by FDA under an Emergency Use  Authorization (EUA). This EUA will remain  in effect (meaning this test can be used) for the duration of the COVID-19 declaration under Section 56 4(b)(1) of the Act, 21 U.S.C. section 360bbb-3(b)(1), unless the authorization is terminated or revoked sooner. Performed at [facility], [address] [place], [place] [address]          Radiology Studies: No results found.      Scheduled Meds:  amLODipine  10 mg Oral Daily   heparin injection (subcutaneous)  5,000 Units Subcutaneous Q8H   lip balm  1 application Topical BID   loratadine  10 mg Oral Daily   metoCLOPramide  10 mg Oral TID AC   metoprolol tartrate  50 mg Oral BID   psyllium  1 packet Oral Daily   saccharomyces boulardii  250 mg Oral BID   sodium chloride flush  3 mL Intravenous Q12H   Continuous Infusions:  sodium chloride       LOS: 9 days    Time spent: 32 minutes spent on chart review, discussion with nursing staff, consultants, updating family and interview/physical exam; more than 50% of that time was spent in counseling and/or coordination of care.    Alvira PhilipsEric J [place], DO [facility] 08/08/2019, 12:01 PM

## 2019-08-08 NOTE — Discharge Summary (Signed)
Physician Discharge Summary  Patient ID: Kent Kelly MRN: [id number] DOB/AGE: 58-Mar-1962 58 y.o.  Admit date: 07/30/2019 Discharge date: 08/08/2019  Admission Diagnoses:  Appendicitis Single kidney   Discharge Diagnoses:  Acute separative appendicitis with generalized peritonitis AKI- 2.56  - single kidney(prior kidney donation) COVID NEGATIVE Hypertension >> Norvasc 10 qd/ lopressor 50 mg BID Postop ileus    - post op ileus>>NG 10/24>> NG out 10/25>> Reglan 10mg  TID/Metamucil    Principal Problem:   Suppurative appendicitis s/p lap appendectomy 07/31/2019 Active Problems:   Elevated serum creatinine   Donor of kidney for transplant   CKD (chronic kidney disease) stage 2, GFR 60-89 ml/min - Baseline Cr 1.6 after L kidney donation   PROCEDURES: Laparoscopic appendectomy 07/31/2019 Dr. 08/02/2019 Course:  Patient is a 58 year old male who presented with diffuse abdominal pain day prior to admission, that have been worsening.  Nothing made it better.  He did report some flatus and loose stools.  No emesis or fever.  He had one kidney after donating 1 to his wife. He was admitted and placed on IV antibiotics along with IV fluid hydration.  He was made n.p.o. Covid test was negative at 2 AM and patient was taken the operating room the following morning by Dr. 41.  Patient was found to have an acute supprative appendicitis with a generalized peritonitis.  He underwent laparoscopic appendectomy was returned to the floor.  He developed significant postoperative ileus.  We initially treated this with bowel rest.  Unfortunately he had to have an NG placed on 08/03/2019.  He improved and was removed after 24 hours.  Despite less nausea or vomiting continued to have an extremely distended abdomen.  We repeated the CT with contrast on 08/05/2019 secondary to concerns of an abscess or other intra-abdominal process causing his prolonged ileus.  CT showed  dilatation of almost the entire small bowel from the fourth portion of the duodenum to the most distal aspect of the ileum.  There is no definite definitive mass lesion seen and these were thought to represent a postoperative ileus without evidence of perforation.  There were no focal fluid collections noted in the region of the appendectomy.  He was continued on bowel rest and IV fluid hydration.  His ileus slowly improved although his distention remains present.  We have advanced him to a soft diet he is having stools but remains fairly distended.  By 08/08/2027 was Dr. 08/10/2027 opinion he can go home on a soft diet but he was maintaining a moderate volume of intake and continue to mobilize.  His port sites all look good, and he is tolerating a soft diet.  On admission he was also found to have AKI.  He has a single kidney.  We thought this was initially due to dehydration his creatinine was 2.56 on admission.  With hydration it eventually improved and dropped down to 1.78.  A medicine consult was obtained to address not only his renal function but also his hypertension.  He was initially started on Norvasc for his hypertension,and then Lopressor 50 mg twice daily was added.  It was Dr. Gordy Savers opinion that his renal function was in part due to dehydration but also possibly due to untreated hypertension.  At discharge patient was on Lopressor 50 mg twice daily and Norvasc 10 mg daily for his blood pressure control.  We plan to continue this at home.  He is to follow-up with his PCP or [facility] kidney to evaluate  his blood pressure and follow his renal function for his single remaining kidney.  Condition on discharge: Improved.  CBC Latest Ref Rng & Units 08/08/2019 08/04/2019 08/02/2019  WBC 4.0 - 10.5 K/uL 7.5 8.3 8.2  Hemoglobin 13.0 - 17.0 g/dL 11.1(L) 11.6(L) 11.9(L)  Hematocrit 39.0 - 52.0 % 34.3(L) 35.6(L) 37.0(L)  Platelets 150 - 400 K/uL 385 329 279   CMP Latest Ref Rng & Units 08/08/2019  08/07/2019 08/06/2019  Glucose 70 - 99 mg/dL 104(H) 100(H) 89  BUN 6 - 20 mg/dL 19 24(H) 25(H)  Creatinine 0.61 - 1.24 mg/dL 1.74(H) 1.96(H) 1.75(H)  Sodium 135 - 145 mmol/L 135 133(L) 134(L)  Potassium 3.5 - 5.1 mmol/L 3.8 3.9 3.9  Chloride 98 - 111 mmol/L 106 106 107  CO2 22 - 32 mmol/L 20(L) 18(L) 18(L)  Calcium 8.9 - 10.3 mg/dL 8.2(L) 8.1(L) 8.2(L)  Total Protein 6.5 - 8.1 g/dL - - -  Total Bilirubin 0.3 - 1.2 mg/dL - - -  Alkaline Phos 38 - 126 U/L - - -  AST 15 - 41 U/L - - -  ALT 0 - 44 U/L - - -    Disposition: Discharge disposition: 01-Home or Self Care        Allergies as of 08/08/2019      Reactions   Pollen Extract       Medication List    STOP taking these medications   dicyclomine 10 MG capsule Commonly known as: BENTYL   fluconazole 200 MG tablet Commonly known as: DIFLUCAN     TAKE these medications   acetaminophen 325 MG tablet Commonly known as: TYLENOL Take 2 tablets (650 mg total) by mouth every 6 (six) hours as needed for mild pain, moderate pain, fever or headache.   amLODipine 10 MG tablet Commonly known as: NORVASC Take 1 tablet (10 mg total) by mouth daily. Start taking on: August 09, 2019   cetirizine 10 MG tablet Commonly known as: ZYRTEC Take 10 mg by mouth daily as needed for allergies.   metoCLOPramide 10 MG tablet Commonly known as: REGLAN Take 1 tablet (10 mg total) by mouth 3 (three) times daily before meals for 5 days.   metoprolol tartrate 50 MG tablet Commonly known as: LOPRESSOR Take 1 tablet (50 mg total) by mouth 2 (two) times daily.   psyllium 95 % Pack Commonly known as: HYDROCIL/METAMUCIL Use daily as package directs, you need to get to 1-2 soft bowel movements per day   simethicone 80 MG chewable tablet Commonly known as: Gas-X Chew 1 tablet (80 mg total) by mouth 4 (four) times daily as needed for up to 5 days for flatulence.      Follow-up Information    [facility], [place] Follow up on  08/20/2019.   Specialty: General Surgery Why: Your appointment is at 9 AM.  Be at the office 30 minutes early for check in.  Bring insurance and photo ID with you.   Contact information: [address] [place] [address] [phone]        [facility] [place] Follow up.   Why: CAll for an appointment with this office or contact your physician in [facility] for referral.   Contact information: [place] [address] [phone]        Stark Klein, MD Follow up.   Specialty: General Surgery Why: Office will call if they can get you in before 08/20/19. Contact information: [address] [place] [address] [phone]  SignedSherrie George: Inaara Tye 08/08/2019, 1:23 PM

## 2019-08-08 NOTE — Plan of Care (Signed)
Assessment unchanged. Pt verbalized understanding of dc instructions through teach back. No scripts. Discharged via foot per request to front entrance accompanied by son and NT.

## 2019-08-12 ENCOUNTER — Ambulatory Visit: Payer: Self-pay

## 2019-08-14 DIAGNOSIS — Z79899 Other long term (current) drug therapy: Secondary | ICD-10-CM | POA: Diagnosis not present

## 2019-08-14 DIAGNOSIS — Z6826 Body mass index (BMI) 26.0-26.9, adult: Secondary | ICD-10-CM | POA: Diagnosis not present

## 2019-08-14 DIAGNOSIS — Z1331 Encounter for screening for depression: Secondary | ICD-10-CM | POA: Diagnosis not present

## 2019-08-14 DIAGNOSIS — Z905 Acquired absence of kidney: Secondary | ICD-10-CM | POA: Diagnosis not present

## 2019-08-14 DIAGNOSIS — K352 Acute appendicitis with generalized peritonitis, without abscess: Secondary | ICD-10-CM | POA: Diagnosis not present

## 2019-08-14 DIAGNOSIS — N179 Acute kidney failure, unspecified: Secondary | ICD-10-CM | POA: Diagnosis not present

## 2019-08-19 ENCOUNTER — Inpatient Hospital Stay: Admit: 2019-08-19 | Payer: BLUE CROSS/BLUE SHIELD | Admitting: General Surgery

## 2019-08-19 SURGERY — APPENDECTOMY, LAPAROSCOPIC
Anesthesia: General

## 2021-03-09 ENCOUNTER — Ambulatory Visit: Payer: 59 | Admitting: Sports Medicine

## 2021-08-15 IMAGING — DX DG ABDOMEN 1V
1 series · 2 of 2 positions shown · non-contrast
Comparison: CT 07/30/2019

CLINICAL DATA: Abdomen pain

EXAM:
ABDOMEN - 1 VIEW

[Series 1: abdomen kub · 0.14mm/px · 2 of 2 slices shown]
[im 1/2]
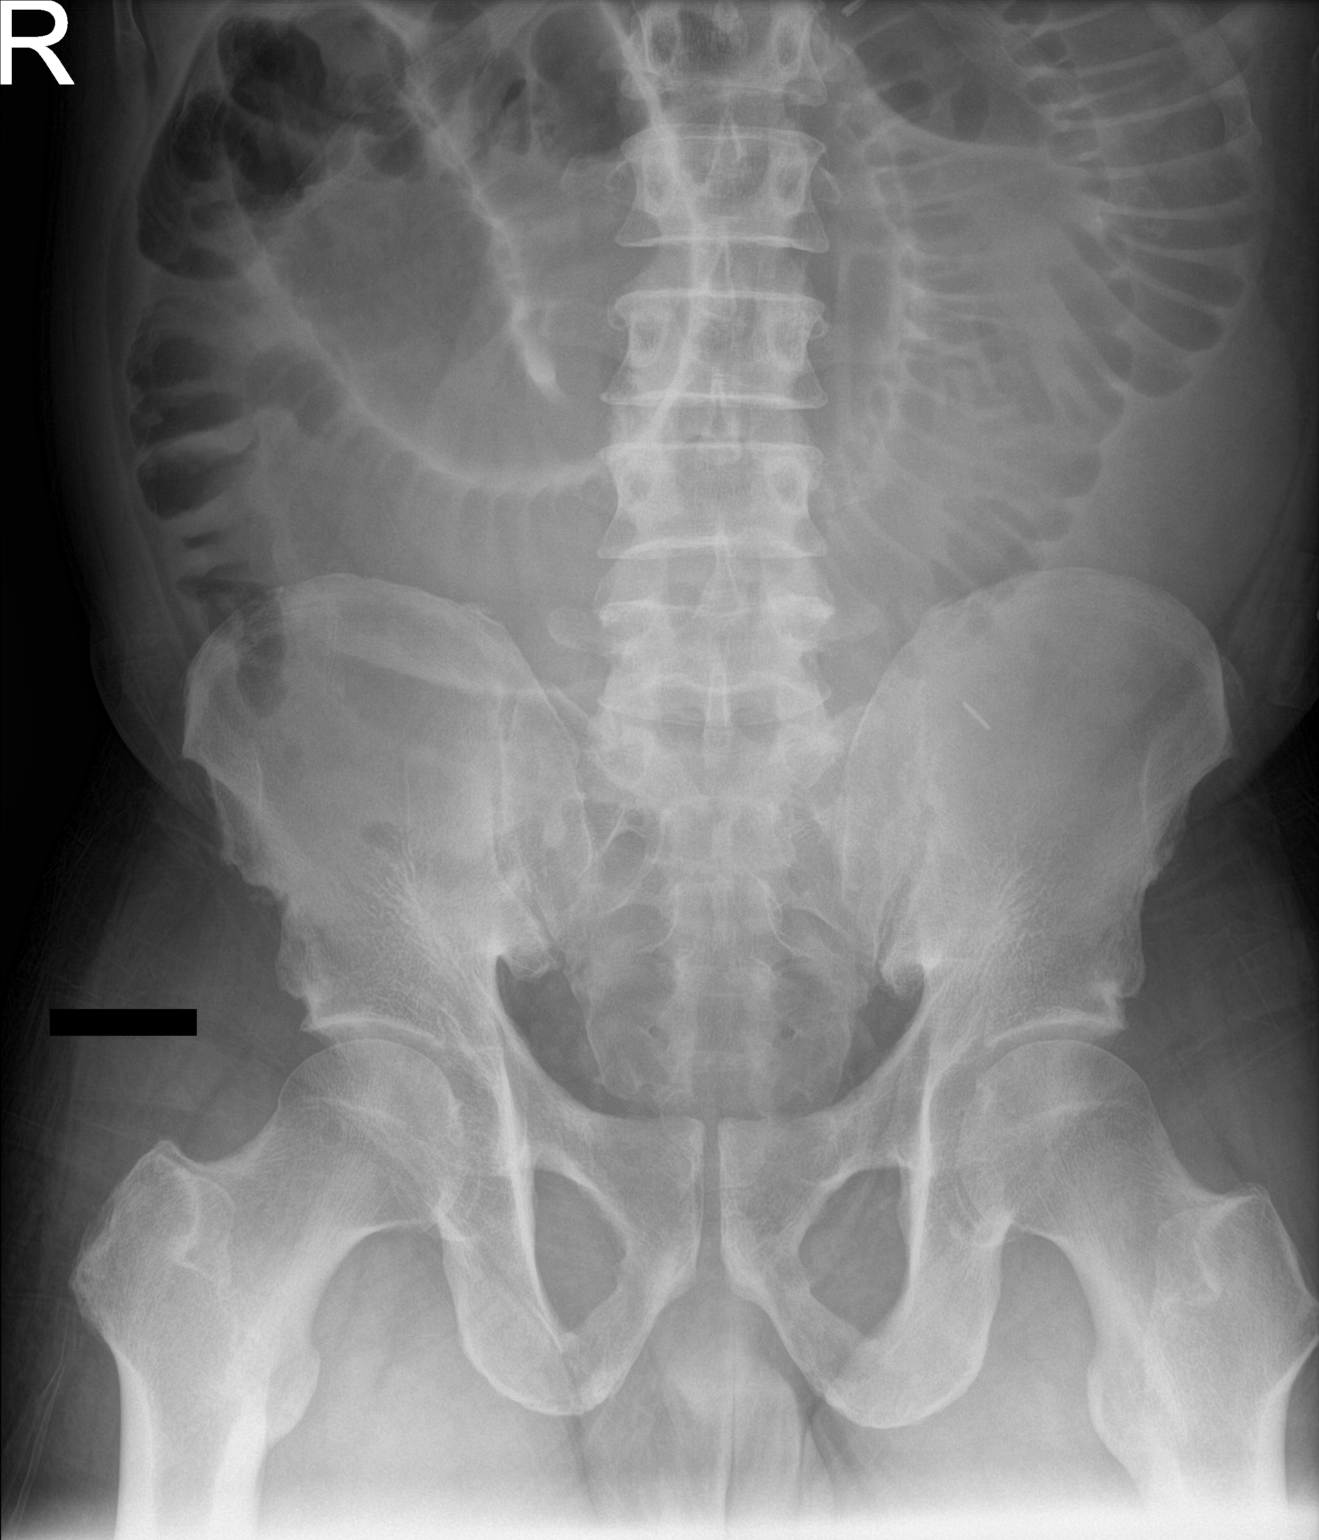
[im 2/2]
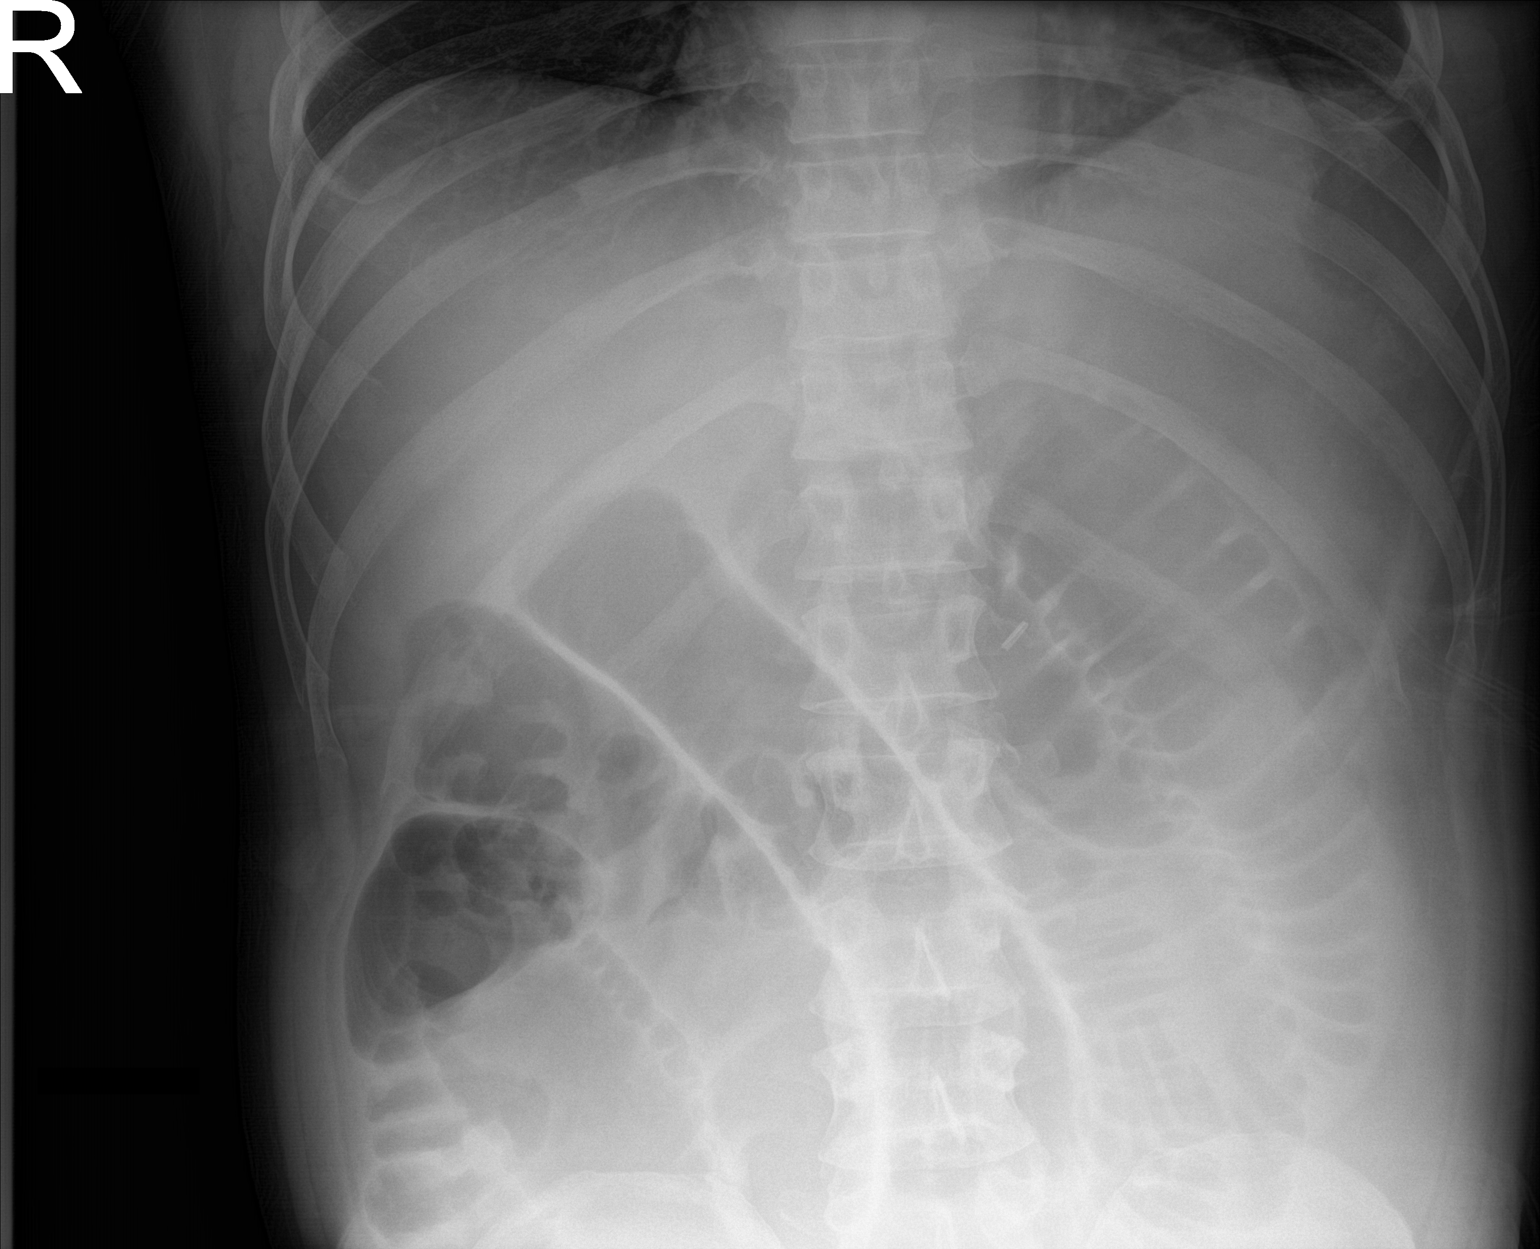

[2 of 2 positions shown; findings below may reference images not displayed]

FINDINGS: Lung bases are clear. Worsened small bowel distension with increased
number and caliber of distended small bowel loops measuring up to
5.9 cm. No radiopaque calculi.
IMPRESSION: Worsening small bowel distension, suspicious for a bowel obstruction

## 2021-08-16 IMAGING — DX DG ABD PORTABLE 1V
1 series · 1 of 1 positions shown · non-contrast
Comparison: None.

CLINICAL DATA: Status post nasogastric tube placement

EXAM:
PORTABLE ABDOMEN - 1 VIEW

[abdomen kub]
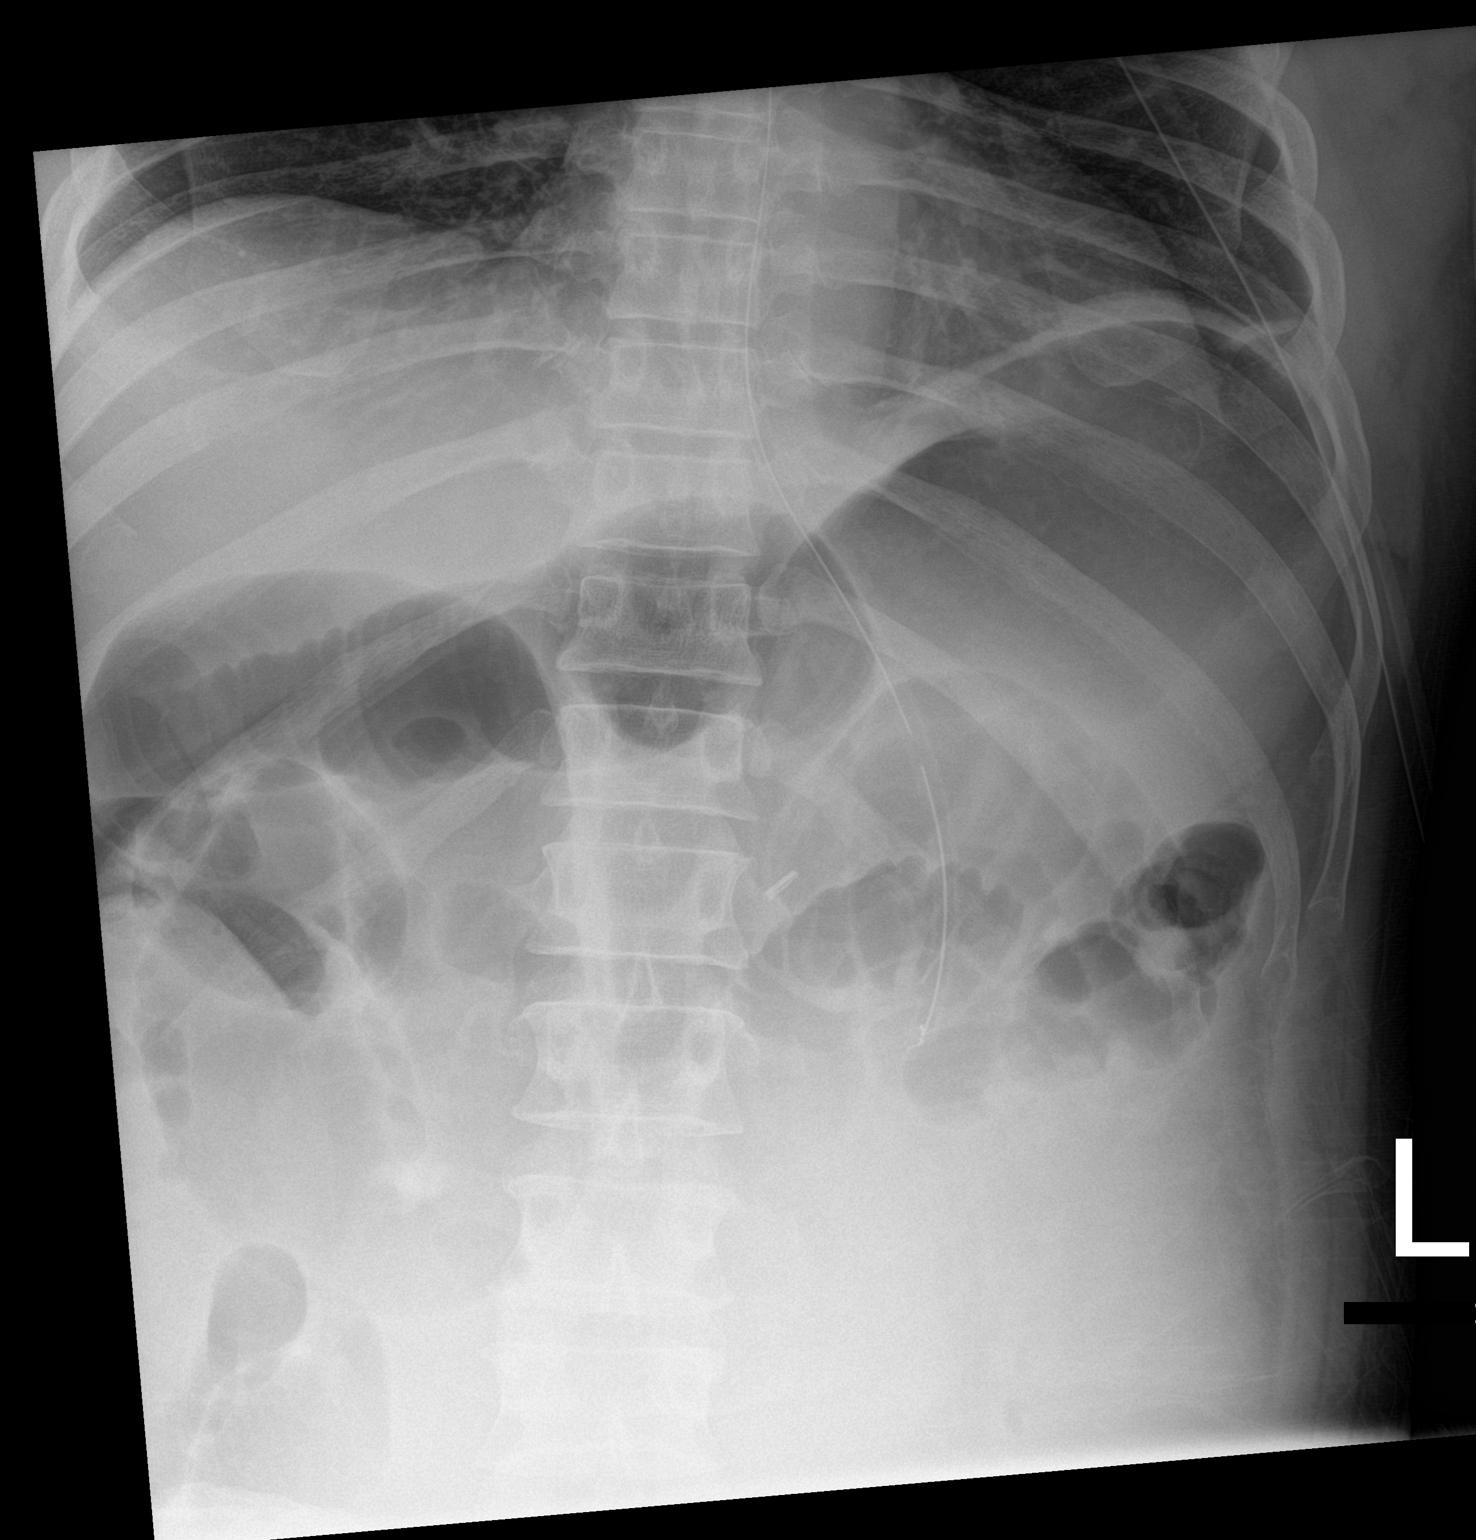

[1 of 1 positions shown; findings below may reference images not displayed]

FINDINGS: There is a nasogastric tube with the tip projecting over the
stomach. There is persistent gaseous distension of the small bowel
which has improved compared with the prior examination consistent
with improving small bowel obstruction. Persistent gaseous
distension of the stomach. There is no bowel dilatation to suggest
obstruction. There is no evidence of pneumoperitoneum, portal venous
gas or pneumatosis.

There are no pathologic calcifications along the expected course of
the ureters.

The osseous structures are unremarkable.
IMPRESSION: Nasogastric tube with the tip projecting over the stomach.

Persistent gaseous distension of the small bowel which has improved
compared with the prior examination consistent with improving small
bowel obstruction.

## 2021-08-18 IMAGING — CT CT ABD-PELV W/ CM
2 of 5 series · 15 of 46 positions shown, 17 images · IV contrast (APPLIED)
Comparison: 07/30/2019

CLINICAL DATA: Diffuse abdominal pain, history of recent
appendicitis, possible ileus

EXAM:
CT ABDOMEN AND PELVIS WITH CONTRAST
TECHNIQUE: Multidetector CT imaging of the abdomen and pelvis was performed
using the standard protocol following bolus administration of
intravenous contrast.
CONTRAST:  100mL OMNIPAQUE IOHEXOL 300 MG/ML SOLN, 30mL OMNIPAQUE
IOHEXOL 300 MG/ML SOLN

[Series 2: axial st · axial · 0.76mm/px · z∈[-444,-34]mm · 12 of 96 slices shown, 14 images]
[im 7/96  soft-tissue]
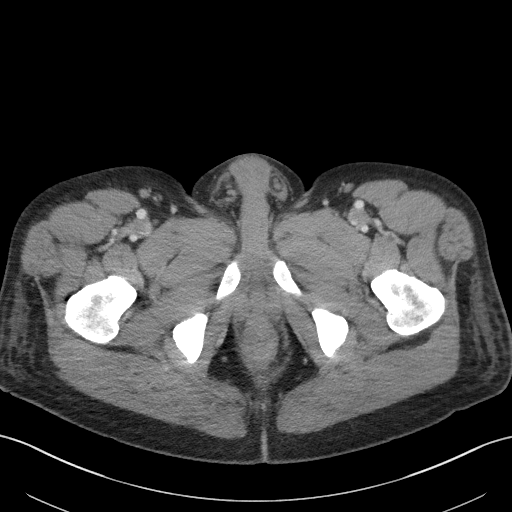
[im 7/96  bone]
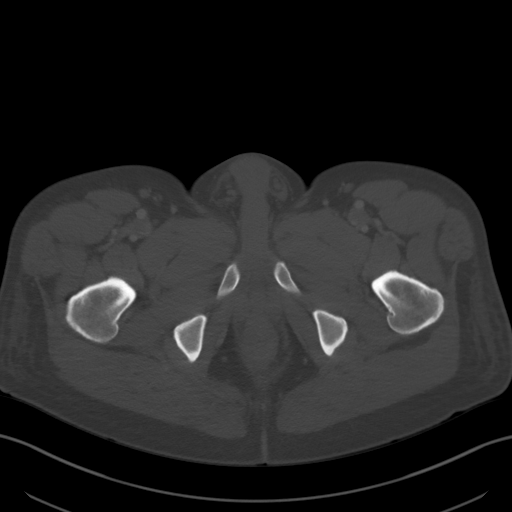
[im 13/96  soft-tissue]
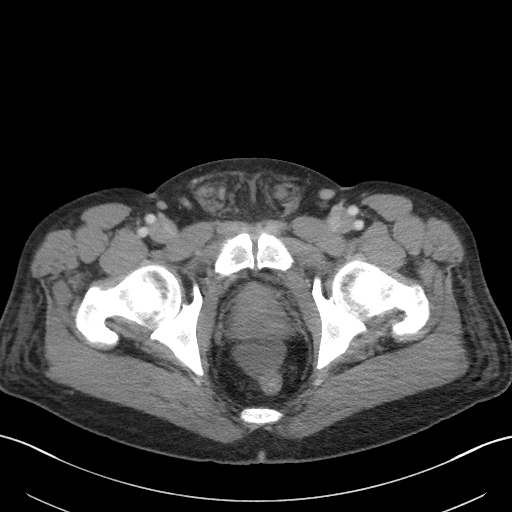
[im 20/96  soft-tissue]
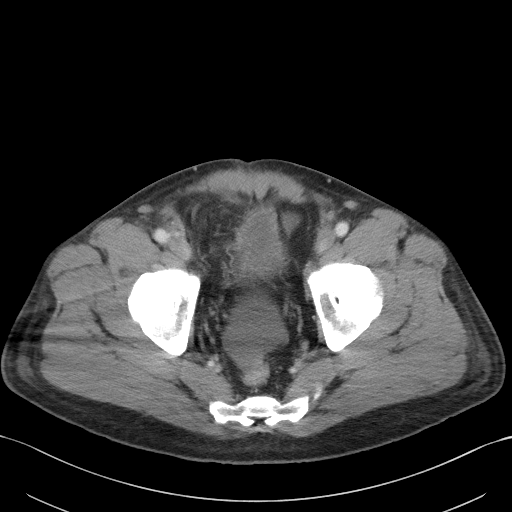
[im 32/96  soft-tissue]
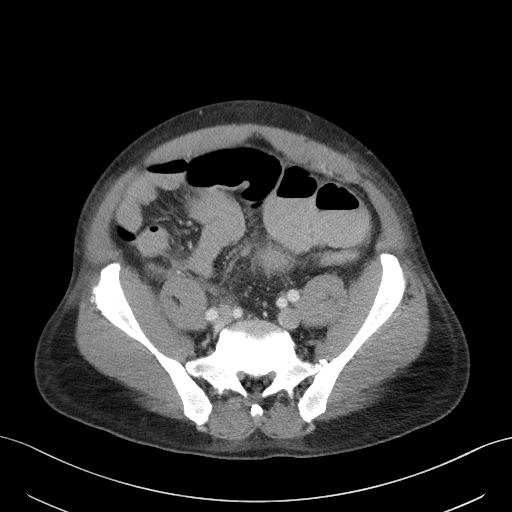
[im 39/96  soft-tissue]
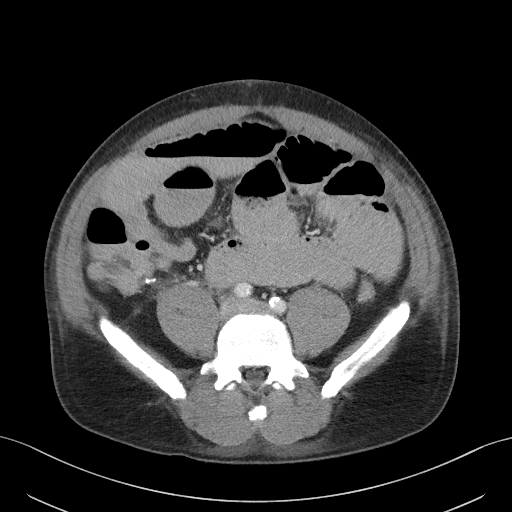
[im 45/96  soft-tissue]
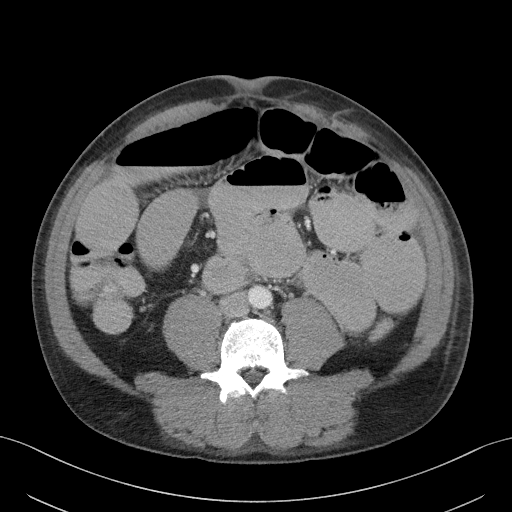
[im 51/96  soft-tissue]
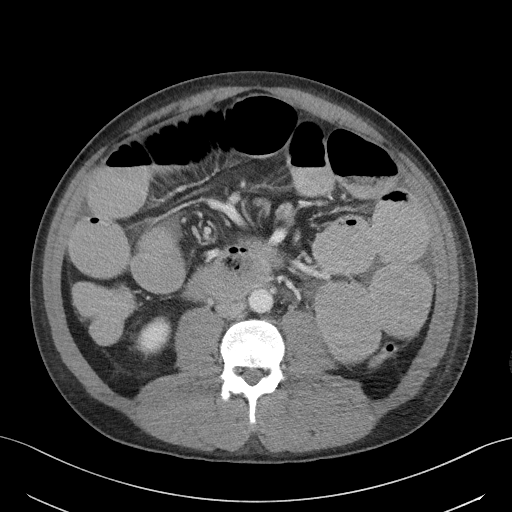
[im 58/96  soft-tissue]
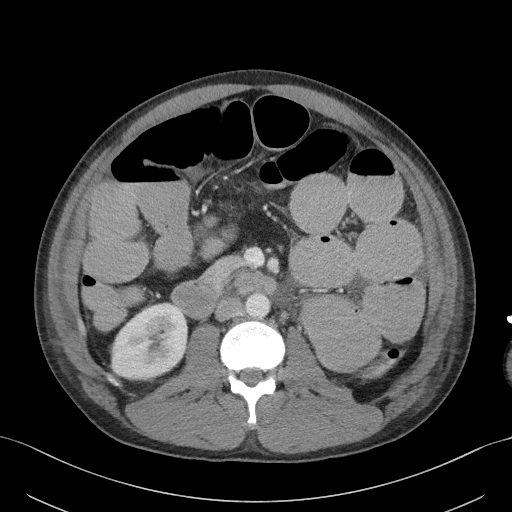
[im 64/96  soft-tissue]
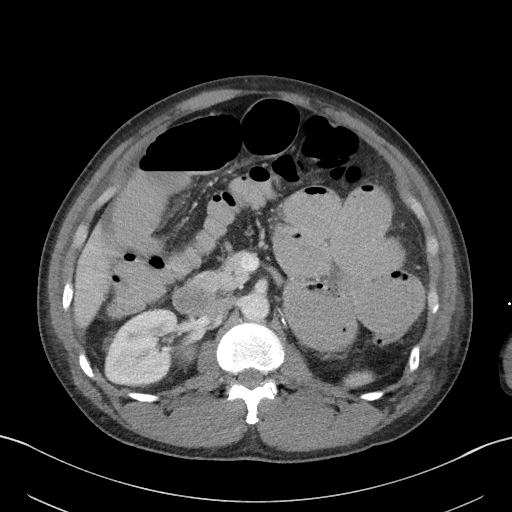
[im 64/96  bone]
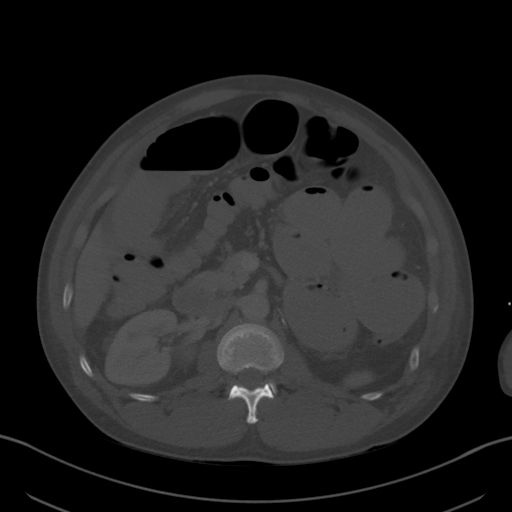
[im 77/96  soft-tissue]
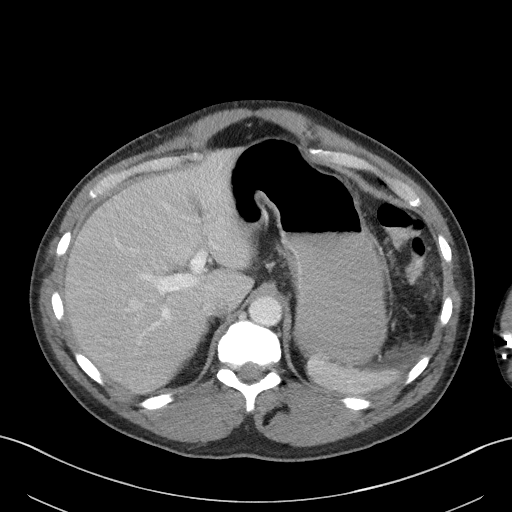
[im 83/96  soft-tissue]
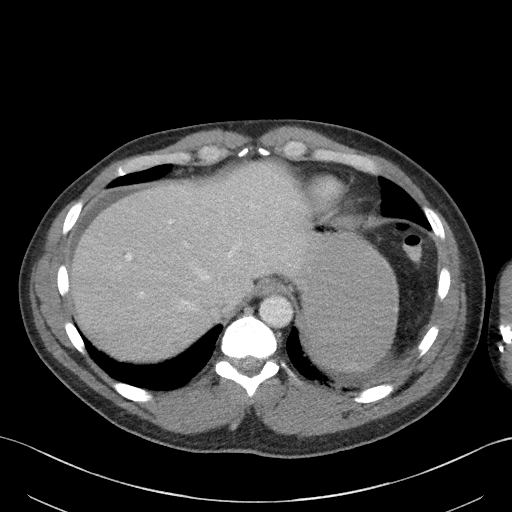
[im 89/96  soft-tissue]
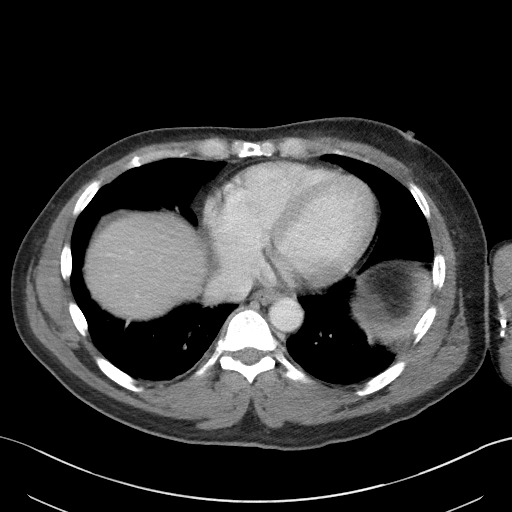

[Series 5: coronal st · coronal · 0.74mm/px · 3 of 103 slices shown]
[im 35/103  soft-tissue]
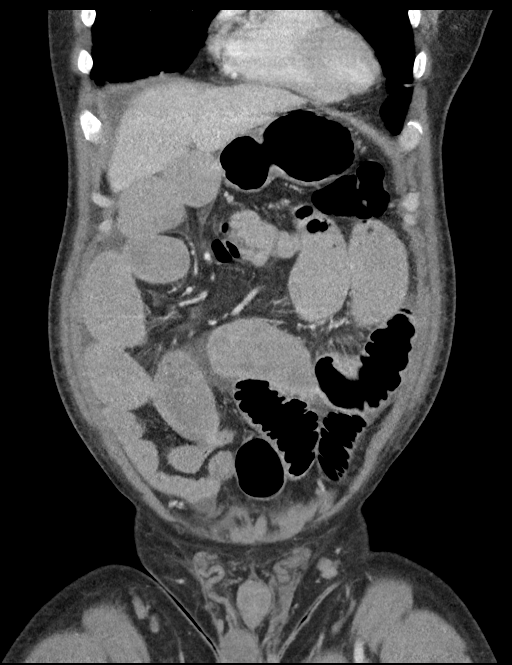
[im 46/103  soft-tissue]
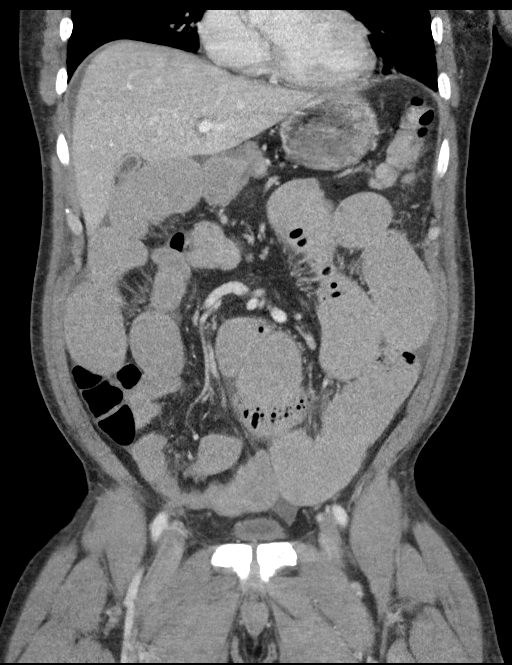
[im 57/103  soft-tissue]
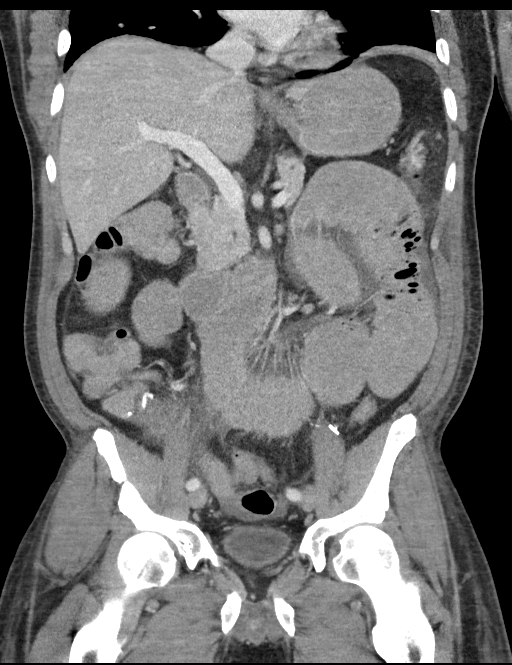

[15 of 46 positions shown; findings below may reference images not displayed]

FINDINGS: Lower chest: Mild left lower lobe atelectatic changes are noted.
Scarring in the right middle lobe is noted stable from the prior
exam.

Hepatobiliary: Tiny hypodensity is noted within the right lobe of
the liver near the dome of the diaphragm likely representing a small
cyst but measuring less than 1 cm. Gallbladder is decompressed.
Minimal amount of perihepatic fluid is noted.

Pancreas: Unremarkable. No pancreatic ductal dilatation or
surrounding inflammatory changes.

Spleen: Spleen is within normal limits. A small amount of free fluid
surrounding the superior aspect of the spleen is noted.

Adrenals/Urinary Tract: Adrenal glands are within normal limits.
Left kidney has been surgically removed, history of donation. The
right kidney is well visualized and within normal limits. The
bladder is decompressed.

Stomach/Bowel: Colon is decompressed. Changes consistent with the
recent appendectomy are noted. No focal fluid collection is noted in
the region of the previous appendectomy. Stomach is within normal
limits. Dilatation of the small bowel is noted starting in the
distal aspect of the duodenum and continuing throughout almost the
entire small bowel. Only to the distal most aspect of the ileum is
decompressed. These changes likely represent a postoperative ileus.
Correlation with the physical exam is recommended.

Vascular/Lymphatic: Aortic atherosclerosis. No enlarged abdominal or
pelvic lymph nodes.

Reproductive: Prostate is unremarkable.

Other: Small amount of free pelvic fluid is noted which may be
related to the recent surgery although may be reactive as well. No
evidence of perforation is seen.

Musculoskeletal: No acute or significant osseous findings.
IMPRESSION: Dilatation of almost the entire small bowel from the fourth portion
of the duodenum to the most distal aspect of the ileum. No
definitive mass lesion is seen and these changes likely represent a
postoperative ileus without evidence of perforation. No nasogastric
catheter is noted at this time.

Small amount of free fluid likely reactive in nature.

Status post left nephrectomy.

## 2022-01-24 DIAGNOSIS — N486 Induration penis plastica: Secondary | ICD-10-CM | POA: Diagnosis not present

## 2022-05-25 DIAGNOSIS — H25813 Combined forms of age-related cataract, bilateral: Secondary | ICD-10-CM | POA: Diagnosis not present

## 2022-05-25 DIAGNOSIS — H04123 Dry eye syndrome of bilateral lacrimal glands: Secondary | ICD-10-CM | POA: Diagnosis not present

## 2022-09-20 DIAGNOSIS — Z823 Family history of stroke: Secondary | ICD-10-CM | POA: Diagnosis not present

## 2022-09-20 DIAGNOSIS — Z809 Family history of malignant neoplasm, unspecified: Secondary | ICD-10-CM | POA: Diagnosis not present

## 2022-09-20 DIAGNOSIS — Z8249 Family history of ischemic heart disease and other diseases of the circulatory system: Secondary | ICD-10-CM | POA: Diagnosis not present

## 2022-09-20 DIAGNOSIS — Z833 Family history of diabetes mellitus: Secondary | ICD-10-CM | POA: Diagnosis not present

## 2022-09-20 DIAGNOSIS — N529 Male erectile dysfunction, unspecified: Secondary | ICD-10-CM | POA: Diagnosis not present

## 2022-09-29 DIAGNOSIS — U071 COVID-19: Secondary | ICD-10-CM | POA: Diagnosis not present

## 2022-09-29 DIAGNOSIS — J069 Acute upper respiratory infection, unspecified: Secondary | ICD-10-CM | POA: Diagnosis not present

## 2023-01-27 DIAGNOSIS — N529 Male erectile dysfunction, unspecified: Secondary | ICD-10-CM | POA: Diagnosis not present

## 2023-01-27 DIAGNOSIS — Z809 Family history of malignant neoplasm, unspecified: Secondary | ICD-10-CM | POA: Diagnosis not present

## 2023-02-03 ENCOUNTER — Other Ambulatory Visit (HOSPITAL_COMMUNITY): Payer: Self-pay | Admitting: Internal Medicine

## 2023-02-03 DIAGNOSIS — Z8249 Family history of ischemic heart disease and other diseases of the circulatory system: Secondary | ICD-10-CM

## 2023-02-28 ENCOUNTER — Other Ambulatory Visit (HOSPITAL_COMMUNITY): Payer: BLUE CROSS/BLUE SHIELD

## 2023-05-30 ENCOUNTER — Encounter (HOSPITAL_COMMUNITY): Payer: Self-pay

## 2023-05-30 ENCOUNTER — Other Ambulatory Visit (HOSPITAL_COMMUNITY): Payer: BLUE CROSS/BLUE SHIELD

## 2024-03-14 ENCOUNTER — Other Ambulatory Visit (HOSPITAL_COMMUNITY): Payer: Self-pay | Admitting: Internal Medicine

## 2024-03-14 DIAGNOSIS — Z8249 Family history of ischemic heart disease and other diseases of the circulatory system: Secondary | ICD-10-CM
